# Patient Record
Sex: Female | Born: 1950 | Race: White | Hispanic: No | Marital: Married | Smoking: Never smoker
Health system: Southern US, Community
[De-identification: ages and names within clinical notes are randomized; demographics above are authoritative.]

## PROBLEM LIST (undated history)

## (undated) DIAGNOSIS — Z9889 Other specified postprocedural states: Secondary | ICD-10-CM

## (undated) DIAGNOSIS — K760 Fatty (change of) liver, not elsewhere classified: Secondary | ICD-10-CM

## (undated) DIAGNOSIS — J45909 Unspecified asthma, uncomplicated: Secondary | ICD-10-CM

## (undated) DIAGNOSIS — N189 Chronic kidney disease, unspecified: Secondary | ICD-10-CM

## (undated) DIAGNOSIS — R112 Nausea with vomiting, unspecified: Secondary | ICD-10-CM

## (undated) DIAGNOSIS — I10 Essential (primary) hypertension: Secondary | ICD-10-CM

## (undated) DIAGNOSIS — I4891 Unspecified atrial fibrillation: Secondary | ICD-10-CM

## (undated) HISTORY — DX: Morbid (severe) obesity due to excess calories: E66.01

## (undated) HISTORY — DX: Unspecified atrial fibrillation: I48.91

## (undated) HISTORY — PX: DILATION AND CURETTAGE OF UTERUS: SHX78

## (undated) HISTORY — DX: Fatty (change of) liver, not elsewhere classified: K76.0

## (undated) HISTORY — PX: TUBAL LIGATION: SHX77

## (undated) HISTORY — PX: FOOT SURGERY: SHX648

## (undated) HISTORY — PX: ABDOMINAL HYSTERECTOMY: SHX81

---

## 2004-03-01 ENCOUNTER — Other Ambulatory Visit: Admission: RE | Admit: 2004-03-01 | Discharge: 2004-03-01 | Payer: Self-pay | Admitting: Unknown Physician Specialty

## 2004-09-14 ENCOUNTER — Ambulatory Visit (HOSPITAL_COMMUNITY): Admission: RE | Admit: 2004-09-14 | Discharge: 2004-09-14 | Payer: Self-pay | Admitting: Family Medicine

## 2004-10-01 ENCOUNTER — Ambulatory Visit: Payer: Self-pay | Admitting: Orthopedic Surgery

## 2004-11-12 ENCOUNTER — Ambulatory Visit: Payer: Self-pay | Admitting: Orthopedic Surgery

## 2006-02-24 ENCOUNTER — Ambulatory Visit (HOSPITAL_COMMUNITY): Admission: RE | Admit: 2006-02-24 | Discharge: 2006-02-24 | Payer: Self-pay | Admitting: Family Medicine

## 2011-07-03 ENCOUNTER — Emergency Department
Admission: EM | Admit: 2011-07-03 | Discharge: 2011-07-03 | Disposition: A | Discharge status: Home / Self Care | Payer: BC Managed Care – PPO | Source: Home / Self Care | Attending: Family Medicine | Admitting: Family Medicine

## 2011-07-03 DIAGNOSIS — J209 Acute bronchitis, unspecified: Secondary | ICD-10-CM

## 2011-07-03 HISTORY — DX: Essential (primary) hypertension: I10

## 2011-07-03 HISTORY — DX: Unspecified asthma, uncomplicated: J45.909

## 2011-07-03 MED ORDER — ALBUTEROL SULFATE HFA 108 (90 BASE) MCG/ACT IN AERS: INHALATION_SPRAY | RESPIRATORY_TRACT | Status: DC

## 2011-07-03 MED ORDER — AZITHROMYCIN 250 MG PO TABS: ORAL_TABLET | ORAL | Status: AC

## 2011-07-03 NOTE — ED Provider Notes (Signed)
History     CSN: [id number]  Arrival date & time 07/03/11  1755   First MD Initiated Contact with Patient 07/03/11 1924      Chief Complaint  Patient presents with  . Laryngitis  . Cough      HPI Comments: Patient complains of onset of URI about 6 to 8 weeks ago, but still has a cough.  She has nasal congestion and post nasal drainage.  No shortness of breath or pleuritic pain.  No fevers, chills, and sweats.  She does not remember her last tetanus shot.   The history is provided by the patient.    Past Medical History  Diagnosis Date  . Diabetes mellitus   . Hypertension   . Reactive airway disease     Past Surgical History  Procedure Date  . Abdominal hysterectomy   . Foot surgery     Family History  Problem Relation Age of Onset  . Parkinsonism Mother   . Osteoarthritis Father     History  Substance Use Topics  . Smoking status: Never Smoker   . Smokeless tobacco: Not on file  . Alcohol Use: No    OB History    Grav Para Term Preterm Abortions TAB SAB Ect Mult Living                  Review of Systems No sore throat + cough No pleuritic pain + wheezing + nasal congestion + post-nasal drainage No sinus pain/pressure No itchy/red eyes No earache No hemoptysis No SOB No fever/chills No nausea No vomiting No abdominal pain No diarrhea No urinary symptoms No skin rashes No fatigue No myalgias No headache Used OTC meds without relief  Allergies  Review of patient's allergies indicates no known allergies.  Home Medications   Current Outpatient Rx  Name Route Sig Dispense Refill  . ACETAMINOPHEN 500 MG PO TABS Oral Take 500 mg by mouth every 6 (six) hours as needed.      Marland Kitchen BENAZEPRIL-HYDROCHLOROTHIAZIDE 20-25 MG PO TABS Oral Take 1 tablet by mouth daily.      Marland Kitchen METFORMIN HCL 500 MG PO TABS Oral Take 500 mg by mouth 2 (two) times daily with a meal.      . ONE-DAILY MULTI VITAMINS PO TABS Oral Take 1 tablet by mouth daily.      .  ALBUTEROL SULFATE HFA 108 (90 BASE) MCG/ACT IN AERS  Inhale one or two puffs into the lungs once or twice daily as needed. 1 Inhaler 0  . AZITHROMYCIN 250 MG PO TABS  Take 2 tabs today; then begin one tab once daily for 4 more days.   DEA # [id number] 6 each 0    BP 150/85  Pulse 89  Temp(Src) 98.4 F (36.9 C) (Oral)  Resp 18  Ht 5\' 6"  (1.676 m)  Wt 322 lb (146.058 kg)  BMI 51.97 kg/m2  SpO2 97%  Physical Exam Nursing notes and Vital Signs reviewed. Appearance:  Patient appears healthy, stated age, and in no acute distress.  Patient is morbidly obese (BMI 52.1) Eyes:  Pupils are equal, round, and reactive to light and accomodation.  Extraocular movement is intact.  Conjunctivae are not inflamed  Ears:  Canals normal.  Tympanic membranes normal.  Nose:  Mildly congested turbinates.  No sinus tenderness.   Marland Kitchen  Pharynx:  Normal Neck:  Supple.  Slightly tender shotty posterior nodes are palpated bilaterally  Lungs:  Clear to auscultation.  Breath sounds are equal.  Heart:  Regular rate and rhythm without murmurs, rubs, or gallops.  Abdomen:  Nontender without masses or hepatosplenomegaly.  Bowel sounds are present.  No CVA or flank tenderness.  Extremities:  No edema.  No calf tenderness Skin:  No rash present.   ED Course  Procedures  none      1. Acute bronchitis       MDM  ? Pertussis Begin Z-pack.  Rx for albuterol inhaler once or twice daily prn.  Take Mucinex  (guaifenesin) twice daily for congestion.  Increase fluid intake, rest. Follow-up with family doctor if not improving 7 to 10 days.  Recommend Tdap when well       Donna Christen, MD 07/08/11 1438

## 2011-07-03 NOTE — ED Notes (Signed)
States 8 weeks ago started with cough and clear sinus drainage unable to get rid of cough.Couple of days go now has color to sinus drainage

## 2011-10-27 ENCOUNTER — Emergency Department (HOSPITAL_COMMUNITY): Payer: BC Managed Care – PPO

## 2011-10-27 ENCOUNTER — Emergency Department (HOSPITAL_COMMUNITY)
Admission: EM | Admit: 2011-10-27 | Discharge: 2011-10-28 | Disposition: A | Discharge status: Home / Self Care | Payer: BC Managed Care – PPO | Attending: Emergency Medicine | Admitting: Emergency Medicine

## 2011-10-27 ENCOUNTER — Encounter (HOSPITAL_COMMUNITY): Payer: Self-pay | Admitting: Emergency Medicine

## 2011-10-27 DIAGNOSIS — I10 Essential (primary) hypertension: Secondary | ICD-10-CM | POA: Insufficient documentation

## 2011-10-27 DIAGNOSIS — R509 Fever, unspecified: Secondary | ICD-10-CM | POA: Insufficient documentation

## 2011-10-27 DIAGNOSIS — R109 Unspecified abdominal pain: Secondary | ICD-10-CM | POA: Insufficient documentation

## 2011-10-27 DIAGNOSIS — Z79899 Other long term (current) drug therapy: Secondary | ICD-10-CM | POA: Insufficient documentation

## 2011-10-27 DIAGNOSIS — N200 Calculus of kidney: Secondary | ICD-10-CM | POA: Insufficient documentation

## 2011-10-27 DIAGNOSIS — R300 Dysuria: Secondary | ICD-10-CM | POA: Insufficient documentation

## 2011-10-27 DIAGNOSIS — R3919 Other difficulties with micturition: Secondary | ICD-10-CM | POA: Insufficient documentation

## 2011-10-27 DIAGNOSIS — R6883 Chills (without fever): Secondary | ICD-10-CM | POA: Insufficient documentation

## 2011-10-27 DIAGNOSIS — E119 Type 2 diabetes mellitus without complications: Secondary | ICD-10-CM | POA: Insufficient documentation

## 2011-10-27 DIAGNOSIS — R110 Nausea: Secondary | ICD-10-CM | POA: Insufficient documentation

## 2011-10-27 DIAGNOSIS — N390 Urinary tract infection, site not specified: Secondary | ICD-10-CM

## 2011-10-27 LAB — URINALYSIS, ROUTINE W REFLEX MICROSCOPIC
Bilirubin Urine: NEGATIVE
Glucose, UA: NEGATIVE mg/dL
Ketones, ur: NEGATIVE mg/dL
Nitrite: NEGATIVE
Protein, ur: 30 mg/dL — AB
pH: 5.5 (ref 5.0–8.0)

## 2011-10-27 LAB — CBC
HCT: 39.8 % (ref 36.0–46.0)
MCH: 30.6 pg (ref 26.0–34.0)
MCHC: 33.9 g/dL (ref 30.0–36.0)
MCV: 90.2 fL (ref 78.0–100.0)
RDW: 13.1 % (ref 11.5–15.5)

## 2011-10-27 LAB — URINE MICROSCOPIC-ADD ON

## 2011-10-27 LAB — COMPREHENSIVE METABOLIC PANEL
ALT: 40 U/L — ABNORMAL HIGH (ref 0–35)
AST: 26 U/L (ref 0–37)
Alkaline Phosphatase: 68 U/L (ref 39–117)
BUN: 8 mg/dL (ref 6–23)
Calcium: 9.1 mg/dL (ref 8.4–10.5)
Chloride: 93 mEq/L — ABNORMAL LOW (ref 96–112)
Creatinine, Ser: 0.96 mg/dL (ref 0.50–1.10)
GFR calc Af Amer: 73 mL/min — ABNORMAL LOW (ref >90)
GFR calc non Af Amer: 63 mL/min — ABNORMAL LOW (ref >90)

## 2011-10-27 LAB — DIFFERENTIAL
Lymphocytes Relative: 8 % — ABNORMAL LOW (ref 12–46)
Lymphs Abs: 0.7 10*3/uL (ref 0.7–4.0)
Monocytes Absolute: 0.8 10*3/uL (ref 0.1–1.0)
Monocytes Relative: 9 % (ref 3–12)
Neutro Abs: 7.2 10*3/uL (ref 1.7–7.7)
Neutrophils Relative %: 82 % — ABNORMAL HIGH (ref 43–77)

## 2011-10-27 MED ORDER — SODIUM CHLORIDE 0.9 % IV SOLN
Freq: Once | INTRAVENOUS | Status: AC
  Administered 2011-10-27: 22:00:00 via INTRAVENOUS

## 2011-10-27 MED ORDER — KETOROLAC TROMETHAMINE 30 MG/ML IJ SOLN
30.0000 mg | Freq: Once | INTRAMUSCULAR | Status: AC
  Administered 2011-10-27: 30 mg via INTRAVENOUS
  Filled 2011-10-27: qty 1

## 2011-10-27 MED ORDER — DEXTROSE 5 % IV SOLN
1.0000 g | Freq: Once | INTRAVENOUS | Status: DC
  Filled 2011-10-27: qty 10

## 2011-10-27 MED ORDER — ONDANSETRON HCL 4 MG/2ML IJ SOLN
4.0000 mg | Freq: Once | INTRAMUSCULAR | Status: AC
  Administered 2011-10-27: 4 mg via INTRAVENOUS
  Filled 2011-10-27: qty 2

## 2011-10-27 MED ORDER — DEXTROSE 5 % IV SOLN: 1.0000 g | Freq: Once | INTRAVENOUS | Status: DC

## 2011-10-27 NOTE — ED Notes (Signed)
Pt woke this am with left flank pain and nausea as well as chills.

## 2011-10-27 NOTE — ED Provider Notes (Signed)
History     CSN: [id number]  Arrival date & time 10/27/11  2120   First MD Initiated Contact with Patient 10/27/11 2141      Chief Complaint  Patient presents with  . Flank Pain  . Chills  . Nausea    (Consider location/radiation/quality/duration/timing/severity/associated sxs/prior treatment) HPI Comments: Crystal Roth woke early this morning with discomfort in her left flank along with nausea and chills but no emesis.  She has also had several episodes of dysuria interspersed with urinary retention.  Her urine has been increasingly cloudy, she denies hematuria.  Her fever has been up to 101.  Pain has been sharp and intermittent and does radiate briefly at times into her left lower flank.  It is not worsened with palpation or movement.  The history is provided by the patient.    Past Medical History  Diagnosis Date  . Diabetes mellitus   . Hypertension   . Reactive airway disease     Past Surgical History  Procedure Date  . Abdominal hysterectomy   . Foot surgery     Family History  Problem Relation Age of Onset  . Parkinsonism Mother   . Osteoarthritis Father     History  Substance Use Topics  . Smoking status: Never Smoker   . Smokeless tobacco: Not on file  . Alcohol Use: No    OB History    Grav Para Term Preterm Abortions TAB SAB Ect Mult Living                  Review of Systems  Constitutional: Negative for fever.  HENT: Negative for congestion, sore throat and neck pain.   Eyes: Negative.   Respiratory: Negative for chest tightness and shortness of breath.   Cardiovascular: Negative for chest pain.  Gastrointestinal: Negative for nausea and abdominal pain.  Genitourinary: Positive for dysuria, flank pain and difficulty urinating. Negative for urgency.  Musculoskeletal: Negative for joint swelling and arthralgias.  Skin: Negative.  Negative for rash and wound.  Neurological: Negative for dizziness, weakness, light-headedness, numbness and  headaches.  Hematological: Negative.   Psychiatric/Behavioral: Negative.     Allergies  Review of patient's allergies indicates no known allergies.  Home Medications   Current Outpatient Rx  Name Route Sig Dispense Refill  . ACETAMINOPHEN 500 MG PO TABS Oral Take 500 mg by mouth every 6 (six) hours as needed.      . ALBUTEROL SULFATE HFA 108 (90 BASE) MCG/ACT IN AERS  Inhale one or two puffs into the lungs once or twice daily as needed. 1 Inhaler 0  . BENAZEPRIL-HYDROCHLOROTHIAZIDE 20-25 MG PO TABS Oral Take 1 tablet by mouth daily.      Marland Kitchen METFORMIN HCL 500 MG PO TABS Oral Take 500 mg by mouth 2 (two) times daily with a meal.      . ONE-DAILY MULTI VITAMINS PO TABS Oral Take 1 tablet by mouth daily.      . CEPHALEXIN 250 MG PO CAPS Oral Take 1 capsule (250 mg total) by mouth 4 (four) times daily. 28 capsule 0  . ONDANSETRON 8 MG PO TBDP Oral Take 1 tablet (8 mg total) by mouth every 8 (eight) hours as needed for nausea. 20 tablet 0  . OXYCODONE-ACETAMINOPHEN 5-325 MG PO TABS Oral Take 1 tablet by mouth every 4 (four) hours as needed for pain. 15 tablet 0    BP 113/70  Pulse 77  Temp(Src) 99.5 F (37.5 C) (Oral)  Resp 16  Ht 5'  5" (1.651 m)  Wt 304 lb (137.893 kg)  BMI 50.59 kg/m2  SpO2 94%  Physical Exam  Nursing note and vitals reviewed. Constitutional: She appears well-developed and well-nourished.  HENT:  Head: Normocephalic and atraumatic.  Eyes: Conjunctivae are normal.  Neck: Normal range of motion.  Cardiovascular: Normal rate, regular rhythm, normal heart sounds and intact distal pulses.   Pulmonary/Chest: Effort normal and breath sounds normal. She has no wheezes.  Abdominal: Soft. Bowel sounds are normal. There is no tenderness. There is CVA tenderness. There is no rebound and no guarding.       Left CVA tenderness.  Musculoskeletal: Normal range of motion.  Neurological: She is alert.  Skin: Skin is warm and dry.  Psychiatric: She has a normal mood and affect.     ED Course  Procedures (including critical care time)  Labs Reviewed  DIFFERENTIAL - Abnormal; Notable for the following:    Neutrophils Relative 82 (*)    Lymphocytes Relative 8 (*)    All other components within normal limits  COMPREHENSIVE METABOLIC PANEL - Abnormal; Notable for the following:    Sodium 133 (*)    Potassium 3.3 (*)    Chloride 93 (*)    Glucose, Bld 177 (*)    Albumin 3.4 (*)    ALT 40 (*)    Total Bilirubin 1.5 (*)    GFR calc non Af Amer 63 (*)    GFR calc Af Amer 73 (*)    All other components within normal limits  URINALYSIS, ROUTINE W REFLEX MICROSCOPIC - Abnormal; Notable for the following:    APPearance HAZY (*)    Hgb urine dipstick SMALL (*)    Protein, ur 30 (*)    Leukocytes, UA TRACE (*)    All other components within normal limits  URINE MICROSCOPIC-ADD ON - Abnormal; Notable for the following:    Squamous Epithelial / LPF FEW (*)    Bacteria, UA FEW (*)    All other components within normal limits  CBC  URINE CULTURE   Ct Abdomen Pelvis Wo Contrast  10/27/2011  *RADIOLOGY REPORT*  Clinical Data: Flank pain, left-sided  CT ABDOMEN AND PELVIS WITHOUT CONTRAST  Technique:  Multidetector CT imaging of the abdomen and pelvis was performed following the standard protocol without intravenous contrast.  Comparison: Abdominal ultrasound 02/24/2006  Findings: Lung bases are clear.  Hepatic hypodensity may indicate steatosis.  Gallbladder, spleen, adrenal glands, pancreas, and right kidney are normal in appearance.  There is moderate left hydronephrosis, renal enlargement, and left perinephric stranding.  Staghorn type curvilinear 2.4 cm calculus identified at the left renal pelvis image 40.  Distal ureters are decompressed.  No radiopaque ureteral or bladder calculus on either side. 1 mm left lower renal pole nonobstructing calculus identified image 44.  No ascites or lymphadenopathy.  Ovaries are normal.  Uterus presumed surgically absent.  Bladder is  normal.  No bowel wall thickening or focal segmental dilatation.  Appendix is normal.  Fat containing umbilical hernia is present.  No acute osseous finding.  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  Original Report Authenticated By: Harrel Lemon, M.D.     1. Urinary tract infection   2. Kidney stone     Patient was given 1 g of Rocephin prior to discharge home.  Toradol 30 mg IV completely relieved her pain.  MDM  Patient discussed with Dr. Estell Harpin prior to discharge home.  Plan will be for patient  to have definitive followup recheck by her PCP tomorrow and patient is agreeable with this plan.  She will return here for recheck if she is unable to be seen by her PCP.  She will continue with Keflex antibiotic, also prescribed oxycodone and Zofran if needed for nausea and continued pain.  Discussed that she will need a urologist to address this staghorn calculus which her PCP can refer her to, but the immediate goal is to alleviate this infection.        Candis Musa, PA 10/28/11 0020

## 2011-10-28 MED ORDER — ONDANSETRON 8 MG PO TBDP: 8.0000 mg | ORAL_TABLET | Freq: Three times a day (TID) | ORAL | Status: AC | PRN

## 2011-10-28 MED ORDER — CEPHALEXIN 250 MG PO CAPS: 250.0000 mg | ORAL_CAPSULE | Freq: Four times a day (QID) | ORAL | Status: DC

## 2011-10-28 MED ORDER — OXYCODONE-ACETAMINOPHEN 5-325 MG PO TABS: 1.0000 | ORAL_TABLET | ORAL | Status: AC | PRN

## 2011-10-28 NOTE — Discharge Instructions (Signed)
Kidney Stones  Kidney stones (ureteral lithiasis) are deposits that form inside your kidneys. The intense pain is caused by the stone moving through the urinary tract. When the stone moves, the ureter goes into spasm around the stone. The stone is usually passed in the urine.   CAUSES    A disorder that makes certain neck glands produce too much parathyroid hormone (primary hyperparathyroidism).   A buildup of uric acid crystals.   Narrowing (stricture) of the ureter.   A kidney obstruction present at birth (congenital obstruction).   Previous surgery on the kidney or ureters.   Numerous kidney infections.  SYMPTOMS    Feeling sick to your stomach (nauseous).   Throwing up (vomiting).   Blood in the urine (hematuria).   Pain that usually spreads (radiates) to the groin.   Frequency or urgency of urination.  DIAGNOSIS    Taking a history and physical exam.   Blood or urine tests.   Computerized X-ray scan (CT scan).   Occasionally, an examination of the inside of the urinary bladder (cystoscopy) is performed.  TREATMENT    Observation.   Increasing your fluid intake.   Surgery may be needed if you have severe pain or persistent obstruction.  The size, location, and chemical composition are all important variables that will determine the proper choice of action for you. Talk to your caregiver to better understand your situation so that you will minimize the risk of injury to yourself and your kidney.   HOME CARE INSTRUCTIONS    Drink enough water and fluids to keep your urine clear or pale yellow.   Strain all urine through the provided strainer. Keep all particulate matter and stones for your caregiver to see. The stone causing the pain may be as small as a grain of salt. It is very important to use the strainer each and every time you pass your urine. The collection of your stone will allow your caregiver to analyze it and verify that a stone has actually passed.   Only take over-the-counter or  prescription medicines for pain, discomfort, or fever as directed by your caregiver.   Make a follow-up appointment with your caregiver as directed.   Get follow-up X-rays if required. The absence of pain does not always mean that the stone has passed. It may have only stopped moving. If the urine remains completely obstructed, it can cause loss of kidney function or even complete destruction of the kidney. It is your responsibility to make sure X-rays and follow-ups are completed. Ultrasounds of the kidney can show blockages and the status of the kidney. Ultrasounds are not associated with any radiation and can be performed easily in a matter of minutes.  SEEK IMMEDIATE MEDICAL CARE IF:    Pain cannot be controlled with the prescribed medicine.   You have a fever.   The severity or intensity of pain increases over 18 hours and is not relieved by pain medicine.   You develop a new onset of abdominal pain.   You feel faint or pass out.  MAKE SURE YOU:    Understand these instructions.   Will watch your condition.   Will get help right away if you are not doing well or get worse.  Document Released: 06/24/2005 Document Revised: 06/13/2011 Document Reviewed: 10/20/2009  Exit[employer] Patient Information 2012 [employer]    Urinary Tract Infection  Infections of the urinary tract can start in several places. A bladder infection (cystitis), a kidney infection (pyelonephritis), and a   prostate infection (prostatitis) are different types of urinary tract infections (UTIs). They usually get better if treated with medicines (antibiotics) that kill germs. Take all the medicine until it is gone. You or your child may feel better in a few days, but TAKE ALL MEDICINE or the infection may not respond and may become more difficult to treat.  HOME CARE INSTRUCTIONS    Drink enough water and fluids to keep the urine clear or pale yellow. Cranberry juice is especially recommended, in addition to large amounts of  water.   Avoid caffeine, tea, and carbonated beverages. They tend to irritate the bladder.   Alcohol may irritate the prostate.   Only take over-the-counter or prescription medicines for pain, discomfort, or fever as directed by your caregiver.  To prevent further infections:   Empty the bladder often. Avoid holding urine for long periods of time.   After a bowel movement, women should cleanse from front to back. Use each tissue only once.   Empty the bladder before and after sexual intercourse.  FINDING OUT THE RESULTS OF YOUR TEST  Not all test results are available during your visit. If your or your child's test results are not back during the visit, make an appointment with your caregiver to find out the results. Do not assume everything is normal if you have not heard from your caregiver or the medical facility. It is important for you to follow up on all test results.  SEEK MEDICAL CARE IF:    There is back pain.   Your baby is older than 3 months with a rectal temperature of 100.5 F (38.1 C) or higher for more than 1 day.   Your or your child's problems (symptoms) are no better in 3 days. Return sooner if you or your child is getting worse.  SEEK IMMEDIATE MEDICAL CARE IF:    There is severe back pain or lower abdominal pain.   You or your child develops chills.   You have a fever.   Your baby is older than 3 months with a rectal temperature of 102 F (38.9 C) or higher.   Your baby is 3 months old or younger with a rectal temperature of 100.4 F (38 C) or higher.   There is nausea or vomiting.   There is continued burning or discomfort with urination.  MAKE SURE YOU:    Understand these instructions.   Will watch your condition.   Will get help right away if you are not doing well or get worse.  Document Released: 04/03/2005 Document Revised: 06/13/2011 Document Reviewed: 11/06/2006  [employer] Patient Information 2012 [employer]

## 2011-10-29 LAB — URINE CULTURE
Colony Count: 35000
Culture  Setup Time: 201304221030

## 2011-10-29 NOTE — ED Provider Notes (Signed)
Medical screening examination/treatment/procedure(s) were conducted as a shared visit with non-physician practitioner(s) and myself.  I personally evaluated the patient during the encounter   Mckenley Birenbaum L Emilina Smarr, MD 10/29/11 0630 

## 2011-11-05 ENCOUNTER — Other Ambulatory Visit: Payer: Self-pay | Admitting: Urology

## 2011-11-05 ENCOUNTER — Encounter (HOSPITAL_COMMUNITY): Payer: Self-pay | Admitting: *Deleted

## 2011-11-05 ENCOUNTER — Encounter (HOSPITAL_COMMUNITY): Payer: Self-pay | Admitting: Pharmacy Technician

## 2011-11-05 NOTE — Pre-Procedure Instructions (Signed)
Patient instructed not to take any aspirin,ibuprofen,etc prior to litho. To arrive in [facility] at 1000 am with blue folder, driver, picture ID, insurance info. Patient not to take metformin the day of litho. To follow laxative instructions in folder, NPO after midnight for solid food may have meds with a sip.patient verbalized understanding of instructions.                                              Patient also revealed she has irregular heart beat , to come to [facility] for EKG 11/06/2011.

## 2011-11-06 ENCOUNTER — Inpatient Hospital Stay (HOSPITAL_COMMUNITY): Admission: RE | Admit: 2011-11-06 | Payer: BC Managed Care – PPO | Source: Ambulatory Visit

## 2011-11-06 ENCOUNTER — Ambulatory Visit (HOSPITAL_COMMUNITY): Payer: BC Managed Care – PPO | Attending: Cardiology

## 2011-11-06 ENCOUNTER — Other Ambulatory Visit (HOSPITAL_COMMUNITY): Payer: Self-pay

## 2011-11-06 ENCOUNTER — Ambulatory Visit (INDEPENDENT_AMBULATORY_CARE_PROVIDER_SITE_OTHER): Payer: BC Managed Care – PPO | Admitting: Internal Medicine

## 2011-11-06 ENCOUNTER — Other Ambulatory Visit: Payer: Self-pay

## 2011-11-06 VITALS — BP 130/76 | HR 88 | Ht 64.5 in | Wt 299.8 lb

## 2011-11-06 DIAGNOSIS — E119 Type 2 diabetes mellitus without complications: Secondary | ICD-10-CM | POA: Insufficient documentation

## 2011-11-06 DIAGNOSIS — I4891 Unspecified atrial fibrillation: Secondary | ICD-10-CM | POA: Insufficient documentation

## 2011-11-06 DIAGNOSIS — Z01810 Encounter for preprocedural cardiovascular examination: Secondary | ICD-10-CM | POA: Insufficient documentation

## 2011-11-06 DIAGNOSIS — I10 Essential (primary) hypertension: Secondary | ICD-10-CM | POA: Insufficient documentation

## 2011-11-06 MED ORDER — APIXABAN 5 MG PO TABS: 5.0000 mg | ORAL_TABLET | Freq: Two times a day (BID) | ORAL | Status: DC

## 2011-11-06 NOTE — Progress Notes (Signed)
CARDIOLOGY CONSULT NOTE  Patient ID: Crystal Roth, MRN: [id number], DOB/AGE: 60-Apr-1952 60 y.o. Admit date: (Not on file) Date of Consult: 11/06/2011  Primary Physician: Harlow Asa, MD, MD    Chief Complaint   HPI Crystal Roth is a 61 y.o. female : Seen at the request of urology and anesthesia because of a newly made diagnosis of atrial fibrillation.  She was being evaluated for lithotripsy. She has developed urinary tract and kidney infection over the last couple of weeks requiring multiple antibiotics. She has a history of kidney stones and now is Staghorn calculus and lithotripsy was recommended.  She denies any palpitations. While she has a more remote history of PVCs couplets and "nonsustained ventricular tachycardia" she has noted no change in her exercise tolerance of late. She has no shortness of breath, orthopnea or chest pain. She does have peripheral edema which has been largely improved Viramune and thickened in her feet up the last week and a half.  Her thromboembolic risk factors are notable for age-4, hypertension-1, diabetes-1 4 at bedtime score of 2 and a CHADS-VASc score of 3.  She has a history of a remote Myoview for atypical chest pain that was negative.  She denies snoring but she does have daytime somnolence.    Past Medical History  Diagnosis Date  . Diabetes mellitus   . Hypertension   . Reactive airway disease   . Dysrhythmia   . kidney calculus   . PONV (postoperative nausea and vomiting)       Surgical History:  Past Surgical History  Procedure Date  . Abdominal hysterectomy   . Foot surgery   . Dilation and curettage of uterus   . Tubal ligation      Home Meds: Prior to Admission medications   Medication Sig Start Date End Date Taking? Authorizing Provider  acetaminophen (TYLENOL) 500 MG tablet Take 500 mg by mouth every 6 (six) hours as needed. Pain    Yes Historical Provider, MD  albuterol (PROVENTIL HFA;VENTOLIN HFA) 108 (90 BASE)  MCG/ACT inhaler Inhale 1-2 puffs into the lungs every 6 (six) hours as needed. Wheezing 07/03/11  Yes Lattie Haw, MD  benazepril-hydrochlorthiazide (LOTENSIN HCT) 20-25 MG per tablet Take 1 tablet by mouth daily before breakfast.    Yes Historical Provider, MD  metFORMIN (GLUCOPHAGE) 500 MG tablet Take 500-1,000 mg by mouth 2 (two) times daily with a meal. Take 2 tablets if bs >150   Yes Historical Provider, MD  ondansetron (ZOFRAN-ODT) 8 MG disintegrating tablet Take 8 mg by mouth every 8 (eight) hours as needed. Nausea   Yes Historical Provider, MD  OVER THE COUNTER MEDICATION Take 1 Package by mouth daily. melluca vitamin pack   Yes Historical Provider, MD  oxyCODONE-acetaminophen (PERCOCET) 5-325 MG per tablet Take 1 tablet by mouth every 4 (four) hours as needed for pain. 10/28/11 11/07/11 Yes Burgess Amor, PA  PROBIOTIC CAPS Take by mouth daily.   Yes Historical Provider, MD  sulfamethoxazole-trimethoprim (BACTRIM DS) 800-160 MG per tablet Take 1 tablet by mouth 2 (two) times daily.   Yes Historical Provider, MD     Allergies:  Allergies  Allergen Reactions  . Nsaids Shortness Of Breath    History   Social History  . Marital Status: Married    Spouse Name: N/A    Number of Children: N/A  . Years of Education: N/A   Occupational History  . Not on file.   Social History Main Topics  . Smoking status:  Never Smoker   . Smokeless tobacco: Not on file  . Alcohol Use: No  . Drug Use: No  . Sexually Active:    Other Topics Concern  . Not on file   Social History Narrative  . No narrative on file     Family History  Problem Relation Age of Onset  . Parkinsonism Mother   . Osteoarthritis Father      ROS:  Please see the history of present illness.   Negative except GE reflux, and eczema  All other systems reviewed and negative.    Physical Exam:  Blood pressure 130/76, pulse 88, height 5' 4.5" (1.638 m), weight 299 lb 12.8 oz (135.988 kg). General: Well developed,   morbidly obese Caucasian  female in no acute distress. Head: Normocephalic, atraumatic, sclera non-icteric, no xanthomas, nares are without discharge. Lymph Nodes:  none Back without kyphosis or scoliosis  Neck: Negative for carotid bruits. JVD not elevated. Lungs: Clear bilaterally to auscultation without wheezes, rales, or rhonchi. Breathing is unlabored. Heart: irregularly irregularwith S1 S2. 2/6 murmur that varies with the RR intervals, rubs, or gallops appreciated. Abdomen: Soft, non-tender, non-distended with normoactive bowel sounds. No hepatomegaly. No rebound/guarding. No obvious abdominal masses. Msk:  Strength and tone appear normal for age. Extremities: No clubbing or cyanosis. No edema.  Distal pedal pulses are 2+ and equal bilaterally. Skin: Warm and Dry Neuro: Alert and oriented X 3. CN III-XII intact Grossly normal sensory and motor function . Psych:  Responds to questions appropriately with a normal affect.      Labs: Cardiac Enzymes No results found for this basename: CKTOTAL:4,CKMB:4,TROPONINI:4 in the last 72 hours CBC Lab Results  Component Value Date   WBC 8.7 10/27/2011   HGB 13.5 10/27/2011   HCT 39.8 10/27/2011   MCV 90.2 10/27/2011   PLT 189 10/27/2011    Radiology/Studies:  Ct Abdomen Pelvis Wo Contrast  10/27/2011  *RADIOLOGY REPORT*  Clinical Data: Flank pain, left-sided  CT ABDOMEN AND PELVIS WITHOUT CONTRAST  Technique:  Multidetector CT imaging of the abdomen and pelvis was performed following the standard protocol without intravenous contrast.  Comparison: Abdominal ultrasound 02/24/2006  Findings: Lung bases are clear.  Hepatic hypodensity may indicate steatosis.  Gallbladder, spleen, adrenal glands, pancreas, and right kidney are normal in appearance.  There is moderate left hydronephrosis, renal enlargement, and left perinephric stranding.  Staghorn type curvilinear 2.4 cm calculus identified at the left renal pelvis image 40.  Distal ureters are  decompressed.  No radiopaque ureteral or bladder calculus on either side. 1 mm left lower renal pole nonobstructing calculus identified image 44.  No ascites or lymphadenopathy.  Ovaries are normal.  Uterus presumed surgically absent.  Bladder is normal.  No bowel wall thickening or focal segmental dilatation.  Appendix is normal.  Fat containing umbilical hernia is present.  No acute osseous finding.  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  Original Report Authenticated By: Harrel Lemon, M.D.     Echocardiogram: Normal left ventricular function biatrial enlargement mild pulmonary hypertension  EKG: atrial fibrillation at 94 Intervals-/09/37 Axis of 48    Assessment and Plan:

## 2011-11-06 NOTE — Patient Instructions (Signed)
Your physician has requested that you have an echocardiogram. Echocardiography is a painless test that uses sound waves to create images of your heart. It provides your doctor with information about the size and shape of your heart and how well your heart's chambers and valves are working. This procedure takes approximately one hour. There are no restrictions for this procedure.  Your physician has recommended you make the following change in your medication:  1) Start Eliquis (apixaban) 5 mg one tablet by mouth twice daily after Dr. Brunilda Payor gives you the ok to do this following your procedure.  Your physician recommends that you schedule a follow-up appointment in: 3-4 weeks with Dr. Graciela Husbands or Tereso Newcomer, PA on a day Dr. Graciela Husbands is in the office.

## 2011-11-06 NOTE — Assessment & Plan Note (Signed)
The patient has asymptomatic atrial fibrillation in the setting of a thromboembolic risk profile or require long-term anticoagulation. I would favor beginning anticoagulation when it is okay with urology and then undertaking elective cardioversion 3-4 weeks later to see if we can restore sinus rhythm.  We've reviewed the benefits and risks of anticoagulation.  The other issue is with her body habitus hypertension and daytime somnolence whether she has sleep apnea which frequently is coccurring with atrial fibrillation and is so therapy would be valuable both in terms of  f reducing blood pressure as well as decreasing the propensity to atrial fibrillation

## 2011-11-06 NOTE — Progress Notes (Addendum)
Pt presents today for EKG prior to lithotripsy tomorrow. She reports leg cramp on 11/05/11. Her potassium from 10/27/11 was 3.3. She states nausea which she relates to urinary infection and antibiotic use as source of nausea. Unconfirmed EKG shows AFib. Paged Dr Brunilda Payor and informed him of abnormal EKG. He is going to call cardiologist today for a consult. 1400: Pt to go to Dr Berton Mount today for assessment. She is given copy of EKG to give to him. He will assess pt and give clearance for lithotripsy on 11/07/11 if pt is stable.

## 2011-11-06 NOTE — Assessment & Plan Note (Signed)
She should be exceptionable risk for her procedure. She has normal left ventricular function and asymptomatic atrial fibrillation with a reasonably controlled ventricular response. Anticoagulation should be resumed per Dr. Brunilda Payor when he feels it is appropriate. She'll be using apixoban which has a rapid onset of action and I will need to be taken into consideration as to when to initiate

## 2011-11-07 ENCOUNTER — Encounter (HOSPITAL_COMMUNITY)
Admission: RE | Disposition: A | Discharge status: Home / Self Care | Payer: Self-pay | Source: Ambulatory Visit | Attending: Urology

## 2011-11-07 ENCOUNTER — Encounter (HOSPITAL_COMMUNITY): Payer: Self-pay

## 2011-11-07 ENCOUNTER — Ambulatory Visit (HOSPITAL_COMMUNITY): Payer: BC Managed Care – PPO

## 2011-11-07 ENCOUNTER — Telehealth: Payer: Self-pay

## 2011-11-07 ENCOUNTER — Ambulatory Visit (HOSPITAL_COMMUNITY)
Admission: RE | Admit: 2011-11-07 | Discharge: 2011-11-07 | Disposition: A | Discharge status: Home / Self Care | Payer: BC Managed Care – PPO | Source: Ambulatory Visit | Attending: Urology | Admitting: Urology

## 2011-11-07 ENCOUNTER — Encounter: Payer: Self-pay | Admitting: Internal Medicine

## 2011-11-07 DIAGNOSIS — R12 Heartburn: Secondary | ICD-10-CM | POA: Insufficient documentation

## 2011-11-07 DIAGNOSIS — Z79899 Other long term (current) drug therapy: Secondary | ICD-10-CM | POA: Insufficient documentation

## 2011-11-07 DIAGNOSIS — I10 Essential (primary) hypertension: Secondary | ICD-10-CM | POA: Insufficient documentation

## 2011-11-07 DIAGNOSIS — I499 Cardiac arrhythmia, unspecified: Secondary | ICD-10-CM | POA: Insufficient documentation

## 2011-11-07 DIAGNOSIS — N200 Calculus of kidney: Secondary | ICD-10-CM | POA: Insufficient documentation

## 2011-11-07 DIAGNOSIS — E119 Type 2 diabetes mellitus without complications: Secondary | ICD-10-CM | POA: Insufficient documentation

## 2011-11-07 DIAGNOSIS — J45909 Unspecified asthma, uncomplicated: Secondary | ICD-10-CM | POA: Insufficient documentation

## 2011-11-07 HISTORY — DX: Other specified postprocedural states: Z98.890

## 2011-11-07 HISTORY — DX: Nausea with vomiting, unspecified: R11.2

## 2011-11-07 HISTORY — DX: Chronic kidney disease, unspecified: N18.9

## 2011-11-07 LAB — GLUCOSE, CAPILLARY: Glucose-Capillary: 128 mg/dL — ABNORMAL HIGH (ref 70–99)

## 2011-11-07 SURGERY — LITHOTRIPSY, ESWL
Anesthesia: LOCAL | Laterality: Left

## 2011-11-07 MED ORDER — CIPROFLOXACIN HCL 500 MG PO TABS
500.0000 mg | ORAL_TABLET | ORAL | Status: AC
  Administered 2011-11-07: 500 mg via ORAL

## 2011-11-07 MED ORDER — DIAZEPAM 5 MG PO TABS
ORAL_TABLET | ORAL | Status: AC
  Administered 2011-11-07: 10 mg via ORAL
  Filled 2011-11-07: qty 2

## 2011-11-07 MED ORDER — DEXTROSE-NACL 5-0.45 % IV SOLN
INTRAVENOUS | Status: DC
  Administered 2011-11-07: 11:00:00 via INTRAVENOUS

## 2011-11-07 MED ORDER — DIPHENHYDRAMINE HCL 25 MG PO CAPS
ORAL_CAPSULE | ORAL | Status: AC
  Administered 2011-11-07: 25 mg via ORAL
  Filled 2011-11-07: qty 1

## 2011-11-07 MED ORDER — DIPHENHYDRAMINE HCL 25 MG PO CAPS
25.0000 mg | ORAL_CAPSULE | ORAL | Status: AC
  Administered 2011-11-07: 25 mg via ORAL

## 2011-11-07 MED ORDER — DIAZEPAM 5 MG PO TABS
10.0000 mg | ORAL_TABLET | ORAL | Status: AC
  Administered 2011-11-07: 10 mg via ORAL

## 2011-11-07 MED ORDER — CIPROFLOXACIN HCL 500 MG PO TABS
ORAL_TABLET | ORAL | Status: AC
  Administered 2011-11-07: 500 mg via ORAL
  Filled 2011-11-07: qty 1

## 2011-11-07 NOTE — H&P (Signed)
History of Present Illness     Crystal Roth is a referral from Dr Lubertha South.  She was seen in the ER at [facility] for severe left flank pain associated with nausea, fever on 4/21.  CT scan revealed a 2.4 cm left renal calculus with moderate hydronephrosis.  She was treated with IV rocephin and sent home on Keflex, Zofran and Percocet.  She went to see Dr Gerda Diss a week ago who gave her IM Rocephin and put her on Cipro.  She continues to have low grade fever.  She saw the PA last Saturday and was switched to [place] which she is still taking.She does not have any more nausea or pain.  Urine culture at [facility] showed multiple bacteria.  Urinalysis today shows 11-20 WBC's, 0-3 RBC's.   She has moderate stress urinary incontinence.  She denies frequency, urgency, dysuria.   Past Medical History Problems  1. History of  Asthma 493.90 2. History of  Heartburn 787.1 3. History of  Hypertension 401.9 4. History of  Murmurs 785.2 5. History of  Sinus Arrhythmia 427.9  Surgical History Problems  1. History of  Dilation And Curettage 2. History of  Foot Surgery Left 3. History of  Hysterectomy V45.77  Current Meds 1. Benazepril-Hydrochlorothiazide 20-25 MG Oral Tablet; Therapy: (Recorded:30Apr2013) to 2. Florastor CAPS; Therapy: (Recorded:30Apr2013) to 3. Glucophage 500 MG Oral Tablet; Therapy: (Recorded:30Apr2013) to 4. Percocet 5-325 MG Oral Tablet; Therapy: (Recorded:30Apr2013) to 5. Septra DS TABS; Therapy: (Recorded:30Apr2013) to 6. Zofran 8 MG Oral Tablet; Therapy: (Recorded:30Apr2013) to  Allergies Medication  1. No Known Drug Allergies  Family History Problems  1. Family history of  Family Health Status - Father's Age 61. Family history of  Family Health Status - Mother's Age 11. Family history of  Family Health Status Number Of Children 4. Family history of  Parkinson's Disease  Social History Problems    Alcohol Use   Caffeine Use   Marital History - Currently  Married   Never A Smoker   Occupation:  Review of Systems Genitourinary, constitutional, skin, eye, otolaryngeal, hematologic/lymphatic, cardiovascular, pulmonary, endocrine, musculoskeletal, gastrointestinal, neurological and psychiatric system(s) were reviewed and pertinent findings if present are noted.  Genitourinary: incontinence and hematuria.  Gastrointestinal: nausea, abdominal pain, heartburn and diarrhea.  Constitutional: fever and feeling tired (fatigue).  Cardiovascular: leg swelling.  Respiratory: cough.    Vitals Vital Signs [Data Includes: Last 1 Day]  30Apr2013 11:11AM  BMI Calculated: 50.59 BSA Calculated: 2.34 Height: 5 ft 4.5 in Weight: 300 lb  Blood Pressure: 134 / 79 Temperature: 98.5 F Heart Rate: 96 Respiration: 18  Physical Exam Constitutional: Well nourished and well developed . No acute distress.  ENT:. The ears and nose are normal in appearance.  Neck: The appearance of the neck is normal and no neck mass is present.  Pulmonary: No respiratory distress and normal respiratory rhythm and effort.  Cardiovascular: Heart rate and rhythm are normal . No peripheral edema.  Abdomen: The abdomen is obese. The abdomen is soft and nontender. No masses are palpated. No CVA tenderness. No hernias are palpable. No hepatosplenomegaly noted.  Genitourinary: Examination of the external genitalia shows normal female external genitalia. The urethra is normal in appearance. Vaginal exam demonstrates no abnormalities. A cystocele is present with a midline defect (grade 2 /4). A rectocele is present. The cervix is is absent. The uterus is absent. The bladder is normal on palpation.  Lymphatics: The femoral and inguinal nodes are not enlarged or tender.  Skin: Normal skin turgor, no visible rash and no visible skin lesions.  Neuro/Psych:. Mood and affect are appropriate.    Results/Data Urine [Data Includes: Last 1 Day]   30Apr2013  COLOR ORANGE   APPEARANCE CLEAR     SQUAMOUS EPITHELIAL/HPF FEW   WBC 11-20 WBC/hpf  RBC 0-3 RBC/hpf  BACTERIA RARE   CRYSTALS NONE SEEN   CASTS NONE SEEN     I have independently reviewed the CT with the patient and the findings are as noted above.   Assessment Assessed  1. Nephrolithiasis Of The Left Kidney 592.0 2. Urinary Tract Infection 599.0  Plan Health Maintenance (V70.0)  1. UA With REFLEX  Done: 30Apr2013 10:37AM Nephrolithiasis Of The Left Kidney (592.0)  2. Follow-up Schedule Surgery Office  Follow-up  Requested for: 30Apr2013   Urine culture.  Continue Septra.  Will switch to a different antibiotic if culture results dictate. I discussed the treatment options with the patient: ESL, PCNL, ureteroscopy with holmium laser.  The risks, benefits of each option were discussed with the patient.  She agrees to proceed with ESL.  She understands that she might require more than one treatment to clear the stone.  The risks of the procedure include but are not limited to hemorrhage, infection, renal or perirenal hematoma, injury to adjacent organs, steinstarsse.  She nderstands and wishes to proceed..   Signatures  CC: Dr Lubertha South  Electronically signed by : Su Grand, M.D.; Nov 05 2011 12:17PM

## 2011-11-07 NOTE — Discharge Instructions (Signed)
Lithotripsy Care After Refer to this sheet for the next few weeks. These discharge instructions provide you with general information on caring for yourself after you leave the hospital. Your caregiver may also give you specific instructions. Your treatment has been planned according to the most current medical practices available, but unavoidable complications sometimes occur. If you have any problems or questions after discharge, please call your caregiver. AFTER THE PROCEDURE   The recovery time will vary with the procedure done.   You will be taken to the recovery area. A nurse will watch and check your progress. Once you are awake, stable, and taking fluids well, you will be allowed to go home as long as there are no problems.   Your urine may have a red tinge for a few days after treatment. Blood loss is usually minimal.   You may have soreness in the back or flank area. This usually goes away after a few days. The procedure can cause blotches or bruises on the back where the pressure wave enters the skin. These marks usually cause only minimal discomfort and should disappear in a short time.   Stone fragments should begin to pass within 24 hours of treatment. However, a delayed passage is not unusual.   You may have pain, discomfort, and feel sick to your stomach (nauseous) when the crushed (pulverized) fragments of stone are passed down the tube from the kidney to the bladder. Stone fragments can pass soon after the procedure and may last for up to 4 to 8 weeks.   A small number of patients may have severe pain when stone fragments are not able to pass, which leads to an obstruction.   If your stone is greater than 1 inch/2.5 centimeters in diameter or if you have multiple stones that have a combined diameter greater than 1 inch/2.5 centimeters, you may require more than 1 treatment.   You must have someone drive you home.  HOME CARE INSTRUCTIONS   Rest at home until you feel your  energy improving.   Only take over-the-counter or prescription medicines for pain, discomfort, or fever as directed by your caregiver. Depending on the type of lithotripsy, you may need to take medicines (antibiotics) that kill germs and anti-inflammatory medicines for a few days.   Drink enough water and fluids to keep your urine clear or pale yellow. This helps "flush" your kidneys. It helps pass any remaining pieces of stone and prevents stones from coming back.   Most people can resume daily activities within 1 or 2 days after standard lithotripsy. It can take longer to recover from laser and percutaneous lithotripsy.   If the stones are in your urinary system, you may be asked to strain your urine at home to look for stones. Any stones that are found can be sent to a medical lab for examination.   Visit your caregiver for a follow-up appointment in a few weeks. Your doctor may remove your stent if you have one. Your caregiver will also check to see whether stone particles still remain.  SEEK MEDICAL CARE IF:   You have an oral temperature above 102 F (38.9 C).   Your pain is not relieved by medicine.   You have a lasting nauseous feeling.   You feel there is too much blood in the urine.   You develop persistent problems with frequent and/or painful urination that does not at least partially improve after 2 days following the procedure.   You have a congested   cough.   You feel lightheaded.   You develop a rash or any other signs that might suggest an allergic problem.   You develop any reaction or side effects to your medicine(s).  SEEK IMMEDIATE MEDICAL CARE IF:   You experience severe back and/or flank pain.   You see nothing but blood when you urinate.   You cannot pass any urine at all.   You have an oral temperature above 102 F (38.9 C), not controlled by medicine.   You develop shortness of breath, difficulty breathing, or chest pain.   You develop vomiting  that will not stop after 6 to 8 hours.   You have a fainting episode.  MAKE SURE YOU:   Understand these instructions.   Will watch your condition.   Will get help right away if you are not doing well or get worse.  Document Released: 07/14/2007 Document Revised: 06/13/2011 Document Reviewed: 07/14/2007 Exit[employer] Patient Information 2012 [employer]. 

## 2011-11-07 NOTE — Telephone Encounter (Signed)
Received call from Dr.Nesi's nurse requesting something in writing for surgical clearance.Patient's office note from 11/07/11 faxed to her at fax # [phone].

## 2011-11-08 ENCOUNTER — Encounter (HOSPITAL_COMMUNITY): Payer: Self-pay

## 2011-11-08 NOTE — Op Note (Signed)
Refer to [facility] Op Note scanned in the chart 

## 2011-11-28 ENCOUNTER — Encounter: Payer: Self-pay | Admitting: *Deleted

## 2011-11-28 ENCOUNTER — Encounter: Payer: Self-pay | Admitting: Physician Assistant

## 2011-11-28 ENCOUNTER — Ambulatory Visit (INDEPENDENT_AMBULATORY_CARE_PROVIDER_SITE_OTHER): Payer: BC Managed Care – PPO | Admitting: Physician Assistant

## 2011-11-28 VITALS — BP 128/80 | HR 87 | Ht 64.0 in | Wt 286.0 lb

## 2011-11-28 DIAGNOSIS — R0989 Other specified symptoms and signs involving the circulatory and respiratory systems: Secondary | ICD-10-CM

## 2011-11-28 DIAGNOSIS — I4891 Unspecified atrial fibrillation: Secondary | ICD-10-CM

## 2011-11-28 DIAGNOSIS — R0683 Snoring: Secondary | ICD-10-CM

## 2011-11-28 NOTE — Patient Instructions (Addendum)
Your physician recommends that you schedule a follow-up appointment in: 5-6 weeks  Your physician recommends that you return for lab work around 6/17 (BMP & CBC)  Your physician has recommended that you have a sleep study. This test records several body functions during sleep, including: brain activity, eye movement, oxygen and carbon dioxide blood levels, heart rate and rhythm, breathing rate and rhythm, the flow of air through your mouth and nose, snoring, body muscle movements, and chest and belly movement.  Your physician has recommended that you have a Cardioversion (DCCV). Electrical Cardioversion uses a jolt of electricity to your heart either through paddles or wired patches attached to your chest. This is a controlled, usually prescheduled, procedure. Defibrillation is done under light anesthesia in the hospital, and you usually go home the day of the procedure. This is done to get your heart back into a normal rhythm. You are not awake for the procedure. Please see the instruction sheet given to you today.

## 2011-11-28 NOTE — Progress Notes (Signed)
[address] [place], [place]  [address] Phone: [phone] Fax:  [phone]  Date:  11/28/2011   Name:  Crystal Roth   DOB:  11/20/1950   MRN:  [id number]  PCP:  LUKING,W S, MD, MD  Primary Cardiologist/Primary Electrophysiologist:  Dr. Steven Klein    History of Present Illness: Crystal Roth is a 61 y.o. female who returns for follow up.  She was initially seen by Dr. Klein 11/07/11 with a new diagnosis of atrial fibrillation.  She was being eval for Lithotripsy for a kidney stone.  She has a CHADS2 score of 2 (HTN, DM).  Dr. Klein placed her on Apixaban with plans for DCCV after 3-4 weeks.  She returns for followup.  2D echocardiogram 11/06/11: - Left ventricle: The cavity size was normal. Wall thickness was increased in a pattern of mild LVH. Systolic function was normal. The estimated ejection fraction was in the range of 60% to 65%. Wall motion was normal; there were no regional wall motion abnormalities. Indeterminant diastolic function. - Aortic valve: There was no stenosis. - Mitral valve: No significant regurgitation. - Left atrium: The atrium was moderately dilated. - Right ventricle: The cavity size was normal. Systolic function was normal. - Right atrium: The atrium was mildly dilated. - Tricuspid valve: Peak RV-RA gradient: 24mm Hg (S). - Pulmonary arteries: PA peak pressure: 29mm Hg (S). - Inferior vena cava: The vessel was normal in size; the respirophasic diameter changes were in the normal range (= 50%); findings are consistent with normal central venous pressure.  Impressions: - The patient was in atrial fibrillation. Normal LV size with mild LV hypertrophy. EF 60-65%. Normal RV size and systolic function. Biatrial enlargement. No significant valvular abnormalities.  She had her lithotripsy performed and she is feeling better.  She denies chest pain, syncope, shortness of breath, orthopnea, PND or significant pedal edema.  She started her  Apixaban on 11/21/11.  No bleeding problems.   Wt Readings from Last 3 Encounters:  11/28/11 286 lb (129.729 kg)  11/07/11 292 lb 4 oz (132.564 kg)  11/07/11 292 lb 4 oz (132.564 kg)     Potassium  Date/Time Value Range Status  10/27/2011  9:50 PM 3.3* 3.5-5.1 (mEq/L) Final     Creatinine, Ser  Date/Time Value Range Status  10/27/2011  9:50 PM 0.96  0.50-1.10 (mg/dL) Final     ALT  Date/Time Value Range Status  10/27/2011  9:50 PM 40* 0-35 (U/L) Final     Hemoglobin  Date/Time Value Range Status  10/27/2011  9:50 PM 13.5  12.0-15.0 (g/dL) Final    Past Medical History  Diagnosis Date  . Diabetes mellitus   . Hypertension   . Reactive airway disease   . kidney calculus   . PONV (postoperative nausea and vomiting)   . Atrial fibrillation     dx 5/13; Apixaban; echo 5/13: mild LVH, EF 60-65%, mod LAe, mild RAE, PASP 29    Current Outpatient Prescriptions  Medication Sig Dispense Refill  . acetaminophen (TYLENOL) 500 MG tablet Take 500 mg by mouth every 6 (six) hours as needed. Pain       . albuterol (PROVENTIL HFA;VENTOLIN HFA) 108 (90 BASE) MCG/ACT inhaler Inhale 1-2 puffs into the lungs every 6 (six) hours as needed. Wheezing      . apixaban (ELIQUIS) 5 MG TABS tablet Take 5 mg by mouth 2 (two) times daily.      . benazepril-hydrochlorthiazide (LOTENSIN HCT) 20-25 MG per tablet Take   1 tablet by mouth daily before breakfast.       . metFORMIN (GLUCOPHAGE) 500 MG tablet Take 1,000 mg by mouth 2 (two) times daily with a meal. Take 2 tablets if bs >150      . ondansetron (ZOFRAN) 8 MG tablet Take 8 mg by mouth every 8 (eight) hours as needed.      . ondansetron (ZOFRAN-ODT) 8 MG disintegrating tablet Take 8 mg by mouth every 8 (eight) hours as needed. Nausea      . oxyCODONE-acetaminophen (PERCOCET) 5-325 MG per tablet as needed.      . OVER THE COUNTER MEDICATION Take 1 Package by mouth daily. melluca vitamin pack        Allergies: Allergies  Allergen Reactions  .  Nsaids Shortness Of Breath    History  Substance Use Topics  . Smoking status: Never Smoker   . Smokeless tobacco: Not on file  . Alcohol Use: No     ROS:  Please see the history of present illness.   She reports a h/o smoking and ? Daytime hypersomnolence.   All other systems reviewed and negative.   PHYSICAL EXAM: VS:  BP 128/80  Pulse 87  Ht 5' 4" (1.626 m)  Wt 286 lb (129.729 kg)  BMI 49.09 kg/m2 Well nourished, well developed, in no acute distress HEENT: normal Neck: no JVD Vascular: no carotid bruits Cardiac:  normal S1, S2; irregularly irregular rhythm; no murmur Lungs:  clear to auscultation bilaterally, no wheezing, rhonchi or rales Abd: soft, nontender, no hepatomegaly Ext: trace bilateral LE edema Skin: warm and dry Neuro:  CNs 2-12 intact, no focal abnormalities noted  EKG:  Atrial fibrillation, heart rate 87, normal axis, no change from prior tracing   ASSESSMENT AND PLAN:  1.  Atrial Fibrillation Persistent. She is now on Apixaban x 7 days. I will arrange elective DCCV in 3 weeks. Check BMET in 6 weeks. Of note, she is asymptomatic.  If she remains in AFib, it would be reasonable to pursue rate control strategy only.  2.  Hypertension Controlled.  Continue current therapy.   3.  Diabetes Managed by PCP.  4.  Nephrolithiasis Improved.   Signed, Usiel Astarita, PA-C  8:53 AM 11/28/2011    

## 2011-12-13 ENCOUNTER — Encounter (HOSPITAL_COMMUNITY): Payer: Self-pay | Admitting: Respiratory Therapy

## 2011-12-23 ENCOUNTER — Ambulatory Visit (HOSPITAL_BASED_OUTPATIENT_CLINIC_OR_DEPARTMENT_OTHER): Payer: BC Managed Care – PPO | Attending: Physician Assistant | Admitting: Radiology

## 2011-12-23 ENCOUNTER — Other Ambulatory Visit: Payer: BC Managed Care – PPO

## 2011-12-23 VITALS — Ht 64.0 in | Wt 273.0 lb

## 2011-12-23 DIAGNOSIS — G4733 Obstructive sleep apnea (adult) (pediatric): Secondary | ICD-10-CM | POA: Insufficient documentation

## 2011-12-23 DIAGNOSIS — I4891 Unspecified atrial fibrillation: Secondary | ICD-10-CM | POA: Insufficient documentation

## 2011-12-23 DIAGNOSIS — R0683 Snoring: Secondary | ICD-10-CM

## 2011-12-23 DIAGNOSIS — R259 Unspecified abnormal involuntary movements: Secondary | ICD-10-CM | POA: Insufficient documentation

## 2011-12-24 ENCOUNTER — Other Ambulatory Visit (INDEPENDENT_AMBULATORY_CARE_PROVIDER_SITE_OTHER): Payer: BC Managed Care – PPO

## 2011-12-24 DIAGNOSIS — I4891 Unspecified atrial fibrillation: Secondary | ICD-10-CM

## 2011-12-24 LAB — CBC WITH DIFFERENTIAL/PLATELET
Eosinophils Relative: 1.7 % (ref 0.0–5.0)
HCT: 40.5 % (ref 36.0–46.0)
Hemoglobin: 13.5 g/dL (ref 12.0–15.0)
Lymphocytes Relative: 45.6 % (ref 12.0–46.0)
Lymphs Abs: 2 10*3/uL (ref 0.7–4.0)
Monocytes Relative: 10.4 % (ref 3.0–12.0)
Platelets: 185 10*3/uL (ref 150.0–400.0)
WBC: 4.4 10*3/uL — ABNORMAL LOW (ref 4.5–10.5)

## 2011-12-24 LAB — BASIC METABOLIC PANEL
CO2: 30 mEq/L (ref 19–32)
Calcium: 8.9 mg/dL (ref 8.4–10.5)
Chloride: 102 mEq/L (ref 96–112)
Glucose, Bld: 124 mg/dL — ABNORMAL HIGH (ref 70–99)
Sodium: 142 mEq/L (ref 135–145)

## 2011-12-25 ENCOUNTER — Telehealth: Payer: Self-pay | Admitting: Internal Medicine

## 2011-12-25 DIAGNOSIS — I4891 Unspecified atrial fibrillation: Secondary | ICD-10-CM

## 2011-12-25 MED ORDER — POTASSIUM CHLORIDE CRYS ER 20 MEQ PO TBCR: 20.0000 meq | EXTENDED_RELEASE_TABLET | Freq: Every day | ORAL | Status: DC

## 2011-12-25 NOTE — Telephone Encounter (Signed)
New problem:  Cardioversion on 6/21. Receive a called from the office on yesterday regarding adjustment of medication.

## 2011-12-25 NOTE — Telephone Encounter (Signed)
Copied from lab result-- Notes Recorded by Beatrice Lecher, PA on 12/24/2011 at 1:23 PM K+ 20 mEq daily Repeat bmet in one week. Tereso Newcomer, PA-C 1:23 PM 12/24/2011      Pt notified.  Will send prescription to [facility] in [place].  She will come in January 02, 2012 for lab work

## 2011-12-27 ENCOUNTER — Encounter (HOSPITAL_COMMUNITY): Payer: Self-pay | Admitting: Anesthesiology

## 2011-12-27 ENCOUNTER — Ambulatory Visit (HOSPITAL_COMMUNITY): Payer: BC Managed Care – PPO | Admitting: Anesthesiology

## 2011-12-27 ENCOUNTER — Encounter (HOSPITAL_COMMUNITY)
Admission: RE | Disposition: A | Discharge status: Home / Self Care | Payer: Self-pay | Source: Ambulatory Visit | Attending: Internal Medicine

## 2011-12-27 ENCOUNTER — Ambulatory Visit (HOSPITAL_COMMUNITY)
Admission: RE | Admit: 2011-12-27 | Discharge: 2011-12-27 | Disposition: A | Discharge status: Home / Self Care | Payer: BC Managed Care – PPO | Source: Ambulatory Visit | Attending: Internal Medicine | Admitting: Internal Medicine

## 2011-12-27 DIAGNOSIS — I10 Essential (primary) hypertension: Secondary | ICD-10-CM | POA: Insufficient documentation

## 2011-12-27 DIAGNOSIS — Z01810 Encounter for preprocedural cardiovascular examination: Secondary | ICD-10-CM

## 2011-12-27 DIAGNOSIS — E119 Type 2 diabetes mellitus without complications: Secondary | ICD-10-CM | POA: Insufficient documentation

## 2011-12-27 DIAGNOSIS — I4891 Unspecified atrial fibrillation: Secondary | ICD-10-CM | POA: Insufficient documentation

## 2011-12-27 HISTORY — PX: CARDIOVERSION: SHX1299

## 2011-12-27 LAB — GLUCOSE, CAPILLARY: Glucose-Capillary: 107 mg/dL — ABNORMAL HIGH (ref 70–99)

## 2011-12-27 SURGERY — CARDIOVERSION
Anesthesia: General | Wound class: Clean

## 2011-12-27 MED ORDER — PROPOFOL 10 MG/ML IV BOLUS
INTRAVENOUS | Status: DC | PRN
  Administered 2011-12-27: 100 mg via INTRAVENOUS

## 2011-12-27 MED ORDER — LACTATED RINGERS IV SOLN
INTRAVENOUS | Status: DC | PRN
  Administered 2011-12-27: 09:00:00 via INTRAVENOUS

## 2011-12-27 NOTE — H&P (View-Only) (Signed)
[address] [place], [place]  [address] Phone: [phone] Fax:  [phone]  Date:  11/28/2011   Name:  Crystal Roth   DOB:  August 25, 1950   MRN:  [id number]  PCP:  Harlow Asa, MD, MD  Primary Cardiologist/Primary Electrophysiologist:  Dr. [name]    History of Present Illness: Crystal Roth is a 61 y.o. female who returns for follow up.  She was initially seen by Dr. Graciela Husbands 11/07/11 with a new diagnosis of atrial fibrillation.  She was being eval for Lithotripsy for a kidney stone.  She has a CHADS2 score of 2 (HTN, DM).  Dr. Graciela Husbands placed her on Apixaban with plans for DCCV after 3-4 weeks.  She returns for followup.  2D echocardiogram 11/06/11: - Left ventricle: The cavity size was normal. Wall thickness was increased in a pattern of mild LVH. Systolic function was normal. The estimated ejection fraction was in the range of 60% to 65%. Wall motion was normal; there were no regional wall motion abnormalities. Indeterminant diastolic function. - Aortic valve: There was no stenosis. - Mitral valve: No significant regurgitation. - Left atrium: The atrium was moderately dilated. - Right ventricle: The cavity size was normal. Systolic function was normal. - Right atrium: The atrium was mildly dilated. - Tricuspid valve: Peak RV-RA gradient: 24mm Hg (S). - Pulmonary arteries: PA peak pressure: 29mm Hg (S). - Inferior vena cava: The vessel was normal in size; the respirophasic diameter changes were in the normal range (= 50%); findings are consistent with normal central venous pressure.  Impressions: - The patient was in atrial fibrillation. Normal LV size with mild LV hypertrophy. EF 60-65%. Normal RV size and systolic function. Biatrial enlargement. No significant valvular abnormalities.  She had her lithotripsy performed and she is feeling better.  She denies chest pain, syncope, shortness of breath, orthopnea, PND or significant pedal edema.  She started her  Apixaban on 11/21/11.  No bleeding problems.   Wt Readings from Last 3 Encounters:  11/28/11 286 lb (129.729 kg)  11/07/11 292 lb 4 oz (132.564 kg)  11/07/11 292 lb 4 oz (132.564 kg)     Potassium  Date/Time Value Range Status  10/27/2011  9:50 PM 3.3* 3.5-5.1 (mEq/L) Final     Creatinine, Ser  Date/Time Value Range Status  10/27/2011  9:50 PM 0.96  0.50-1.10 (mg/dL) Final     ALT  Date/Time Value Range Status  10/27/2011  9:50 PM 40* 0-35 (U/L) Final     Hemoglobin  Date/Time Value Range Status  10/27/2011  9:50 PM 13.5  12.0-15.0 (g/dL) Final    Past Medical History  Diagnosis Date  . Diabetes mellitus   . Hypertension   . Reactive airway disease   . kidney calculus   . PONV (postoperative nausea and vomiting)   . Atrial fibrillation     dx 5/13; Apixaban; echo 5/13: mild LVH, EF 60-65%, mod LAe, mild RAE, PASP 29    Current Outpatient Prescriptions  Medication Sig Dispense Refill  . acetaminophen (TYLENOL) 500 MG tablet Take 500 mg by mouth every 6 (six) hours as needed. Pain       . albuterol (PROVENTIL HFA;VENTOLIN HFA) 108 (90 BASE) MCG/ACT inhaler Inhale 1-2 puffs into the lungs every 6 (six) hours as needed. Wheezing      . apixaban (ELIQUIS) 5 MG TABS tablet Take 5 mg by mouth 2 (two) times daily.      . benazepril-hydrochlorthiazide (LOTENSIN HCT) 20-25 MG per tablet Take  1 tablet by mouth daily before breakfast.       . metFORMIN (GLUCOPHAGE) 500 MG tablet Take 1,000 mg by mouth 2 (two) times daily with a meal. Take 2 tablets if bs >150      . ondansetron (ZOFRAN) 8 MG tablet Take 8 mg by mouth every 8 (eight) hours as needed.      . ondansetron (ZOFRAN-ODT) 8 MG disintegrating tablet Take 8 mg by mouth every 8 (eight) hours as needed. Nausea      . oxyCODONE-acetaminophen (PERCOCET) 5-325 MG per tablet as needed.      Marland Kitchen OVER THE COUNTER MEDICATION Take 1 Package by mouth daily. melluca vitamin pack        Allergies: Allergies  Allergen Reactions  .  Nsaids Shortness Of Breath    History  Substance Use Topics  . Smoking status: Never Smoker   . Smokeless tobacco: Not on file  . Alcohol Use: No     ROS:  Please see the history of present illness.   She reports a h/o smoking and ? Daytime hypersomnolence.   All other systems reviewed and negative.   PHYSICAL EXAM: VS:  BP 128/80  Pulse 87  Ht 5\' 4"  (1.626 m)  Wt 286 lb (129.729 kg)  BMI 49.09 kg/m2 Well nourished, well developed, in no acute distress HEENT: normal Neck: no JVD Vascular: no carotid bruits Cardiac:  normal S1, S2; irregularly irregular rhythm; no murmur Lungs:  clear to auscultation bilaterally, no wheezing, rhonchi or rales Abd: soft, nontender, no hepatomegaly Ext: trace bilateral LE edema Skin: warm and dry Neuro:  CNs 2-12 intact, no focal abnormalities noted  EKG:  Atrial fibrillation, heart rate 87, normal axis, no change from prior tracing   ASSESSMENT AND PLAN:  1.  Atrial Fibrillation Persistent. She is now on Apixaban x 7 days. I will arrange elective DCCV in 3 weeks. Check BMET in 6 weeks. Of note, she is asymptomatic.  If she remains in AFib, it would be reasonable to pursue rate control strategy only.  2.  Hypertension Controlled.  Continue current therapy.   3.  Diabetes Managed by PCP.  4.  Nephrolithiasis Improved.   Luna Glasgow, PA-C  8:53 AM 11/28/2011

## 2011-12-27 NOTE — Anesthesia Procedure Notes (Signed)
Procedure Name: MAC Date/Time: 12/27/2011 8:55 AM Performed by: Marena Chancy Pre-anesthesia Checklist: Patient identified, Emergency Drugs available, Suction available, Patient being monitored and Timeout performed Patient Re-evaluated:Patient Re-evaluated prior to inductionOxygen Delivery Method: Nasal cannula, Simple face mask and Ambu bag Preoxygenation: Pre-oxygenation with 100% oxygen Intubation Type: IV induction

## 2011-12-27 NOTE — Anesthesia Postprocedure Evaluation (Deleted)
  Anesthesia Post-op Note  Patient: Crystal Roth  Procedure(s) Performed: Procedure(s) (LRB): CARDIOVERSION (N/A)  Patient Location: PACU and Short Stay  Anesthesia Type: MAC and General  Level of Consciousness: awake, alert  and oriented  Airway and Oxygen Therapy: Patient Spontanous Breathing and Patient connected to nasal cannula oxygen  Post-op Pain: none  Post-op Assessment: Post-op Vital signs reviewed, Patient's Cardiovascular Status Stable, Respiratory Function Stable, Patent Airway, No signs of Nausea or vomiting, No headache and No backache  Post-op Vital Signs: Reviewed and stable  Complications: No apparent anesthesia complications

## 2011-12-27 NOTE — Transfer of Care (Signed)
Immediate Anesthesia Transfer of Care Note  Patient: Crystal Roth  Procedure(s) Performed: Procedure(s) (LRB): CARDIOVERSION (N/A)  Patient Location: PACU and Short Stay  Anesthesia Type: MAC and General  Level of Consciousness: awake, alert  and oriented  Airway & Oxygen Therapy: Patient Spontanous Breathing and Patient connected to nasal cannula oxygen  Post-op Assessment: Report given to PACU RN and Post -op Vital signs reviewed and stable  Post vital signs: Reviewed and stable  Complications: No apparent anesthesia complications

## 2011-12-27 NOTE — Anesthesia Postprocedure Evaluation (Signed)
  Anesthesia Post-op Note  Patient: Crystal Roth  Procedure(s) Performed: Procedure(s) (LRB): CARDIOVERSION (N/A)  Patient Location: PACU and Short Stay  Anesthesia Type: MAC and General  Level of Consciousness: awake, alert  and oriented  Airway and Oxygen Therapy: Patient Spontanous Breathing  Post-op Pain: none  Post-op Assessment: Post-op Vital signs reviewed, Patient's Cardiovascular Status Stable, Respiratory Function Stable and Patent Airway  Post-op Vital Signs: Reviewed and stable  Complications: No apparent anesthesia complications

## 2011-12-27 NOTE — Anesthesia Preprocedure Evaluation (Addendum)
Anesthesia Evaluation  Patient identified by MRN, date of birth, ID band Patient awake    Reviewed: Allergy & Precautions, H&P , NPO status , Patient's Chart, lab work & pertinent test results  History of Anesthesia Complications (+) PONV  Airway Mallampati: II      Dental  (+) Teeth Intact   Pulmonary          Cardiovascular hypertension, Pt. on medications + dysrhythmias Atrial Fibrillation     Neuro/Psych    GI/Hepatic   Endo/Other  Diabetes mellitus-, Well Controlled, Type 2  Renal/GU      Musculoskeletal   Abdominal   Peds  Hematology   Anesthesia Other Findings   Reproductive/Obstetrics                           Anesthesia Physical Anesthesia Plan  ASA: III  Anesthesia Plan: General and MAC   Post-op Pain Management:    Induction: Intravenous  Airway Management Planned: Mask  Additional Equipment:   Intra-op Plan:   Post-operative Plan:   Informed Consent: I have reviewed the patients History and Physical, chart, labs and discussed the procedure including the risks, benefits and alternatives for the proposed anesthesia with the patient or authorized representative who has indicated his/her understanding and acceptance.   Dental advisory given  Plan Discussed with:   Anesthesia Plan Comments:         Anesthesia Quick Evaluation

## 2011-12-27 NOTE — Discharge Instructions (Signed)
General Anesthetic, Adult A doctor specialized in giving anesthesia (anesthesiologist) or a nurse specialized in giving anesthesia (nurse anesthetist) gives medicine that makes you sleep while a procedure is performed (general anesthetic). Once the general anesthetic has been administered, you will be in a sleeplike state in which you feel no pain. After having a general anestheticyou may feel:   Dizzy.   Weak.   Drowsy.   Confused.  These feelings are normal and can be expected to last for up to 24 hours after the procedure is completed.  LET YOUR CAREGIVER KNOW ABOUT:  Allergies you have.   Medications you are taking, including herbs, eye drops, over the counter medications, dietary supplements, and creams.   Previous problems you have had with anesthetics or numbing medicines.   Use of cigarettes, alcohol, or illicit drugs.   Possibility of pregnancy, if this applies.   History of bleeding or blood disorders, including blood clots and clotting disorders.   Previous surgeries you have had and types of anesthetics you have received.   Family medical history, especially anesthetic problems.   Other health problems.  BEFORE THE PROCEDURE  You may brush your teeth on the morning of surgery but you should have no solid food or non-clear liquids for a minimum of 8 hours prior to your procedure. Clear liquids (water, black coffee, and tea) are acceptable in small amounts until 2 hours prior to your procedure.   You may take your regular medications the morning of your procedure unless your caregiver indicates otherwise.  AFTER THE PROCEDURE  After surgery, you will be taken to the recovery area where a nurse will monitor your progress. You will be allowed to go home when you are awake, stable, taking fluids well, and without serious pain or complications.   For the first 24 hours following an anesthetic:   Have a responsible person with you.   Do not drive a car. If you are  alone, do not take public transportation.   Do not engage in strenuous activity. You may usually resume normal activities the next day, or as advised by your caregiver.   Do not drink alcohol.   Do not take medicine that has not been prescribed by your caregiver.   Do not sign important papers or make important decisions as your judgement may be impaired.   You may resume a normal diet as directed.   Change bandages (dressings) as directed.   Only take over-the-counter or prescription medicines for pain, discomfort, or fever as directed by your caregiver.  If you have questions or problems that seem related to the anesthetic, call the hospital and ask for the anesthetist, anesthesiologist, or anesthesia department. SEEK IMMEDIATE MEDICAL CARE IF:   You develop a rash.   You have difficulty breathing.   You have chest pain.   You have allergic problems.   You have uncontrolled nausea.   You have uncontrolled vomiting.   You develop any serious bleeding, especially from the incision site.  Document Released: 10/01/2007 Document Revised: 06/13/2011 Document Reviewed: 10/25/2010 Exit[employer] Patient Information 2012 [employer]. 

## 2011-12-27 NOTE — Preoperative (Signed)
Beta Blockers   Reason not to administer Beta Blockers:Not Applicable 

## 2011-12-27 NOTE — Interval H&P Note (Signed)
History and Physical Interval Note:  12/27/2011 8:57 AM  Crystal Roth  has presented today for surgery, with the diagnosis of AFIB  The various methods of treatment have been discussed with the patient and family. After consideration of risks, benefits and other options for treatment, the patient has consented to  Procedure(s) (LRB): CARDIOVERSION (N/A) as a surgical intervention .  The patient's history has been reviewed, patient examined, no change in status, stable for surgery.  I have reviewed the patients' chart and labs.  Questions were answered to the patient's satisfaction.     [name]  Pt still in Afib; for DCCV this am Took  apixoban this am on schedule

## 2011-12-27 NOTE — CV Procedure (Signed)
Preop Dx afib Post op DX  NSR  Procedure  DC Cardioversion   Pt was sedated by anesthesia receiving 100 mg Propafol  A synchronized shock 120 joules failed to restore sinus rhythm A synchronized shock 200  joules restored sinus Rhythm   Pt tolerated without difficulty

## 2012-01-01 DIAGNOSIS — G4733 Obstructive sleep apnea (adult) (pediatric): Secondary | ICD-10-CM

## 2012-01-01 DIAGNOSIS — R259 Unspecified abnormal involuntary movements: Secondary | ICD-10-CM

## 2012-01-01 DIAGNOSIS — I4891 Unspecified atrial fibrillation: Secondary | ICD-10-CM

## 2012-01-01 NOTE — Procedures (Signed)
Crystal Roth, Crystal Roth                ACCOUNT NO.:  [id number]  MEDICAL RECORD NO.:  [id number]          PATIENT TYPE:  OUT  LOCATION:  SLEEP CENTER                 FACILITY[facility]  [facility]  PHYSICIAN:  Barbaraann Share, MD,FCCPDATE OF BIRTH:  1951/06/14  DATE OF STUDY:  12/23/2011                           NOCTURNAL POLYSOMNOGRAM  REFERRING PHYSICIAN:  Tereso Newcomer, PA-C  INDICATION FOR STUDY:  Hypersomnia with sleep apnea.  EPWORTH SLEEPINESS SCORE:  7.  SLEEP ARCHITECTURE:  The patient had total sleep time of 350 minutes with no slow-wave sleep and only 54 minutes of REM.  Sleep onset latency is normal at 31 minutes, and REM onset was prolonged at 150 minutes. Sleep efficiency is mildly reduced to 84%.  RESPIRATORY DATA:  The patient was found have 27 apneas and 42 obstructive hypopneas, giving her an apnea-hypopnea index of 12 events per hour.  The events occurred in all body positions, but were increased in REM.  Moderate snoring was noted throughout.  OXYGEN DATA:  There was O2 desaturation as low as 80% with the patient's obstructive event.  CARDIAC DATA:  The patient was noted to be in atrial fibrillation with a controlled ventricular response.  There was an occasional PVC noted.  MOVEMENTS/PARASOMNIA:  The patient had 674 leg jerks with 9 per hour resulting in arousal or awakening.  There were no behavioral abnormalities noted.  IMPRESSION/RECOMMENDATIONS: 1. Mild obstructive sleep apnea/hypopnea syndrome with an AHI of 12     events per hour and transient O2 desaturation as low as 80%.     Treatment for this degree of sleep apnea can include a trial of     weight loss alone, upper airway surgery, dental appliance, and also     CPAP.  Clinical correlation is suggested.  The decision to treat     this degree of sleep apnea aggressively should depend upon its     impact to the patient's quality of life. 2. Atrial fibrillation noted with a controlled ventricular response.  The patient also had occasional PVCs. 3. Very large numbers of periodic limb movements with what appears to     be significant sleep disruption.  Clinical correlation is suggested     to evaluate for a primary movement disorder of sleep.     Barbaraann Share, MD,FCCP Diplomate, American Board of Sleep Medicine    [facility]MED[facility]  D:  01/01/2012 08:05:21  T:  01/01/2012 08:49:06  Job:  [id number]

## 2012-01-02 ENCOUNTER — Encounter (HOSPITAL_COMMUNITY): Payer: Self-pay | Admitting: Internal Medicine

## 2012-01-02 ENCOUNTER — Other Ambulatory Visit (INDEPENDENT_AMBULATORY_CARE_PROVIDER_SITE_OTHER): Payer: BC Managed Care – PPO

## 2012-01-02 DIAGNOSIS — I4891 Unspecified atrial fibrillation: Secondary | ICD-10-CM

## 2012-01-02 LAB — BASIC METABOLIC PANEL
CO2: 29 mEq/L (ref 19–32)
Calcium: 9.4 mg/dL (ref 8.4–10.5)
Creatinine, Ser: 0.7 mg/dL (ref 0.4–1.2)
GFR: 93.7 mL/min (ref >60.00)
Sodium: 140 mEq/L (ref 135–145)

## 2012-01-08 ENCOUNTER — Encounter: Payer: Self-pay | Admitting: *Deleted

## 2012-01-08 ENCOUNTER — Ambulatory Visit (INDEPENDENT_AMBULATORY_CARE_PROVIDER_SITE_OTHER): Payer: BC Managed Care – PPO | Admitting: Internal Medicine

## 2012-01-08 ENCOUNTER — Encounter: Payer: Self-pay | Admitting: Internal Medicine

## 2012-01-08 VITALS — BP 126/90 | HR 83 | Ht 64.0 in | Wt 275.0 lb

## 2012-01-08 DIAGNOSIS — R9431 Abnormal electrocardiogram [ECG] [EKG]: Secondary | ICD-10-CM

## 2012-01-08 DIAGNOSIS — I4891 Unspecified atrial fibrillation: Secondary | ICD-10-CM

## 2012-01-08 MED ORDER — FLECAINIDE ACETATE 100 MG PO TABS: ORAL_TABLET | ORAL | Status: DC

## 2012-01-08 NOTE — Assessment & Plan Note (Signed)
The patient has recurrent atrial fibrillation is largely asymptomatic.  We discussed the potential role of sinus rhythm versus atrial fibrillation with the data from AFFIRM trial and value of maintained sinus rhythm versus atempted sinus rhythm.  We have elected thereafter to pursue a strategy of rhythm control. We will begin her on flecainide and undertake cardioversion.  With her abnormal electrocardiogram, will need stress Myoview scanning following cardioversion from coronary disease given the use of a 1C agent.

## 2012-01-08 NOTE — Patient Instructions (Addendum)
Your physician has recommended that you have a Cardioversion (DCCV). Electrical Cardioversion uses a jolt of electricity to your heart either through paddles or wired patches attached to your chest. This is a controlled, usually prescheduled, procedure. Defibrillation is done under light anesthesia in the hospital, and you usually go home the day of the procedure. This is done to get your heart back into a normal rhythm. You are not awake for the procedure. Please see the instruction sheet given to you today.  Your physician has recommended you make the following change in your medication:  1) Start flecainide 100 mg 1/2 tablet by mouth twice daily for 3 days, then increase to a whole tablet by mouth twice daily.  Your physician has requested that you have an exercise stress myoview. For further information please visit [email or web address] Please follow instruction sheet, as given.  Your physician recommends that you schedule a follow-up appointment in: 6 weeks.

## 2012-01-08 NOTE — Progress Notes (Signed)
  HPI  Crystal Roth is a 61 y.o. female sseeen in followup for atrial fibrillation;  Echo demonstrated by atrial enlargement  In May 2013 she was started on apixoban and underwent cardioversion 2 weeks ago>>now AF  Sleep study >>Mild OSA  She has no associated symptoms  Past Medical History  Diagnosis Date  . Diabetes mellitus   . Hypertension   . Reactive airway disease   . kidney calculus   . PONV (postoperative nausea and vomiting)   . Atrial fibrillation     dx 5/13; Apixaban; echo 5/13: mild LVH, EF 60-65%, mod LAe, mild RAE, PASP 29    Past Surgical History  Procedure Date  . Abdominal hysterectomy   . Foot surgery   . Dilation and curettage of uterus   . Tubal ligation   . Cardioversion 12/27/2011    Procedure: CARDIOVERSION;  Surgeon: Duke Salvia, MD;  Location: [facility] OR;  Service: Cardiovascular;  Laterality: N/A;    Current Outpatient Prescriptions  Medication Sig Dispense Refill  . acetaminophen (TYLENOL) 500 MG tablet Take 500 mg by mouth every 6 (six) hours as needed. Pain       . albuterol (PROVENTIL HFA;VENTOLIN HFA) 108 (90 BASE) MCG/ACT inhaler Inhale 1-2 puffs into the lungs every 6 (six) hours as needed. Wheezing      . allopurinol (ZYLOPRIM) 100 MG tablet Take 100 mg by mouth daily.       Marland Kitchen apixaban (ELIQUIS) 5 MG TABS tablet Take 5 mg by mouth 2 (two) times daily.      . benazepril-hydrochlorthiazide (LOTENSIN HCT) 20-25 MG per tablet Take 1 tablet by mouth daily before breakfast.       . metFORMIN (GLUCOPHAGE) 500 MG tablet Take 1,000 mg by mouth 2 (two) times daily with a meal. Take 2 tablets if bs >150      . OVER THE COUNTER MEDICATION Take 1 Package by mouth daily. melluca vitamin pack      . oxyCODONE-acetaminophen (PERCOCET) 5-325 MG per tablet Take 1 tablet by mouth as needed. For pain      . potassium chloride SA (K-DUR,KLOR-CON) 20 MEQ tablet Take 1 tablet (20 mEq total) by mouth daily.  30 tablet  6    Allergies  Allergen Reactions  .  Nsaids Shortness Of Breath    Review of Systems negative except from HPI and PMH  Physical Exam BP 126/90  Pulse 83  Ht 5\' 4"  (1.626 m)  Wt 275 lb (124.739 kg)  BMI 47.20 kg/m2 Well developed and well nourished in no acute distress HENT normal E scleral and icterus clear Neck Supple JVP flat; carotids brisk and full Clear to ausculation irregularly irregular  no murmurs gallops or rub Soft with active bowel sounds No clubbing cyanosis none Edema Alert and oriented, grossly normal motor and sensory function Skin Warm and Dry  ECG demonstrates atrial fibrillation 3 Intervals-/09/29\ Low voltage Possible septal infarct Assessment and  Plan

## 2012-01-13 ENCOUNTER — Encounter (HOSPITAL_COMMUNITY): Payer: Self-pay | Admitting: Respiratory Therapy

## 2012-01-17 ENCOUNTER — Ambulatory Visit (HOSPITAL_COMMUNITY)
Admission: RE | Admit: 2012-01-17 | Discharge: 2012-01-17 | Disposition: A | Discharge status: Home / Self Care | Payer: BC Managed Care – PPO | Source: Ambulatory Visit | Attending: Cardiovascular Disease | Admitting: Cardiovascular Disease

## 2012-01-17 ENCOUNTER — Encounter (HOSPITAL_COMMUNITY): Payer: Self-pay | Admitting: *Deleted

## 2012-01-17 ENCOUNTER — Encounter (HOSPITAL_COMMUNITY): Payer: Self-pay | Admitting: Certified Registered"

## 2012-01-17 ENCOUNTER — Ambulatory Visit (HOSPITAL_COMMUNITY): Payer: BC Managed Care – PPO | Admitting: Certified Registered"

## 2012-01-17 ENCOUNTER — Encounter (HOSPITAL_COMMUNITY)
Admission: RE | Disposition: A | Discharge status: Home / Self Care | Payer: Self-pay | Source: Ambulatory Visit | Attending: Cardiovascular Disease

## 2012-01-17 DIAGNOSIS — E119 Type 2 diabetes mellitus without complications: Secondary | ICD-10-CM | POA: Insufficient documentation

## 2012-01-17 DIAGNOSIS — I10 Essential (primary) hypertension: Secondary | ICD-10-CM | POA: Insufficient documentation

## 2012-01-17 DIAGNOSIS — I4891 Unspecified atrial fibrillation: Secondary | ICD-10-CM | POA: Insufficient documentation

## 2012-01-17 DIAGNOSIS — J45909 Unspecified asthma, uncomplicated: Secondary | ICD-10-CM | POA: Insufficient documentation

## 2012-01-17 HISTORY — PX: CARDIOVERSION: SHX1299

## 2012-01-17 LAB — CBC
MCHC: 34 g/dL (ref 30.0–36.0)
Platelets: 201 10*3/uL (ref 150–400)
RDW: 14.1 % (ref 11.5–15.5)

## 2012-01-17 LAB — BASIC METABOLIC PANEL
GFR calc Af Amer: >90 mL/min (ref >90)
GFR calc non Af Amer: >90 mL/min (ref >90)
Potassium: 3.4 mEq/L — ABNORMAL LOW (ref 3.5–5.1)
Sodium: 140 mEq/L (ref 135–145)

## 2012-01-17 LAB — GLUCOSE, CAPILLARY: Glucose-Capillary: 102 mg/dL — ABNORMAL HIGH (ref 70–99)

## 2012-01-17 SURGERY — CARDIOVERSION
Anesthesia: General | Wound class: Clean

## 2012-01-17 MED ORDER — SODIUM CHLORIDE 0.9 % IV SOLN
INTRAVENOUS | Status: DC | PRN
  Administered 2012-01-17: 13:00:00 via INTRAVENOUS

## 2012-01-17 MED ORDER — PROPOFOL 10 MG/ML IV EMUL
INTRAVENOUS | Status: DC | PRN
  Administered 2012-01-17: 70 mg via INTRAVENOUS

## 2012-01-17 NOTE — Anesthesia Postprocedure Evaluation (Signed)
  Anesthesia Post-op Note  Patient: Crystal Roth  Procedure(s) Performed: Procedure(s) (LRB): CARDIOVERSION (N/A)  Patient Location: PACU and Short Stay  Anesthesia Type: General  Level of Consciousness: awake, alert  and oriented  Airway and Oxygen Therapy: Patient Spontanous Breathing  Post-op Pain: none  Post-op Assessment: Post-op Vital signs reviewed  Post-op Vital Signs: stable  Complications: No apparent anesthesia complications 

## 2012-01-17 NOTE — H&P (View-Only) (Signed)
  HPI  Crystal Roth is a 61 y.o. female sseeen in followup for atrial fibrillation;  Echo demonstrated by atrial enlargement  In May 2013 she was started on apixoban and underwent cardioversion 2 weeks ago>>now AF  Sleep study >>Mild OSA  She has no associated symptoms  Past Medical History  Diagnosis Date  . Diabetes mellitus   . Hypertension   . Reactive airway disease   . kidney calculus   . PONV (postoperative nausea and vomiting)   . Atrial fibrillation     dx 5/13; Apixaban; echo 5/13: mild LVH, EF 60-65%, mod LAe, mild RAE, PASP 29    Past Surgical History  Procedure Date  . Abdominal hysterectomy   . Foot surgery   . Dilation and curettage of uterus   . Tubal ligation   . Cardioversion 12/27/2011    Procedure: CARDIOVERSION;  Surgeon: Steven C Klein, MD;  Location: [facility] OR;  Service: Cardiovascular;  Laterality: N/A;    Current Outpatient Prescriptions  Medication Sig Dispense Refill  . acetaminophen (TYLENOL) 500 MG tablet Take 500 mg by mouth every 6 (six) hours as needed. Pain       . albuterol (PROVENTIL HFA;VENTOLIN HFA) 108 (90 BASE) MCG/ACT inhaler Inhale 1-2 puffs into the lungs every 6 (six) hours as needed. Wheezing      . allopurinol (ZYLOPRIM) 100 MG tablet Take 100 mg by mouth daily.       . apixaban (ELIQUIS) 5 MG TABS tablet Take 5 mg by mouth 2 (two) times daily.      . benazepril-hydrochlorthiazide (LOTENSIN HCT) 20-25 MG per tablet Take 1 tablet by mouth daily before breakfast.       . metFORMIN (GLUCOPHAGE) 500 MG tablet Take 1,000 mg by mouth 2 (two) times daily with a meal. Take 2 tablets if bs >150      . OVER THE COUNTER MEDICATION Take 1 Package by mouth daily. melluca vitamin pack      . oxyCODONE-acetaminophen (PERCOCET) 5-325 MG per tablet Take 1 tablet by mouth as needed. For pain      . potassium chloride SA (K-DUR,KLOR-CON) 20 MEQ tablet Take 1 tablet (20 mEq total) by mouth daily.  30 tablet  6    Allergies  Allergen Reactions  .  Nsaids Shortness Of Breath    Review of Systems negative except from HPI and PMH  Physical Exam BP 126/90  Pulse 83  Ht 5' 4" (1.626 m)  Wt 275 lb (124.739 kg)  BMI 47.20 kg/m2 Well developed and well nourished in no acute distress HENT normal E scleral and icterus clear Neck Supple JVP flat; carotids brisk and full Clear to ausculation irregularly irregular  no murmurs gallops or rub Soft with active bowel sounds No clubbing cyanosis none Edema Alert and oriented, grossly normal motor and sensory function Skin Warm and Dry  ECG demonstrates atrial fibrillation 3 Intervals-/09/29\ Low voltage Possible septal infarct Assessment and  Plan  

## 2012-01-17 NOTE — Anesthesia Postprocedure Evaluation (Signed)
  Anesthesia Post-op Note  Patient: Crystal Roth  Procedure(s) Performed: Procedure(s) (LRB): CARDIOVERSION (N/A)  Patient Location: PACU and Short Stay  Anesthesia Type: General  Level of Consciousness: awake, alert  and oriented  Airway and Oxygen Therapy: Patient Spontanous Breathing  Post-op Pain: none  Post-op Assessment: Post-op Vital signs reviewed  Post-op Vital Signs: stable  Complications: No apparent anesthesia complications

## 2012-01-17 NOTE — CV Procedure (Signed)
    Cardioversion Note  Crystal Roth [id number] 1951/06/12  Procedure: DC Cardioversion Indications: Recurrent Atrial Fibrillation  Procedure Details Consent: Obtained Time Out: Verified patient identification, verified procedure, site/side was marked, verified correct patient position, special equipment/implants available, Radiology Safety Procedures followed,  medications/allergies/relevent history reviewed, required imaging and test results available.  Performed  The patient has been on adequate anticoagulation.  The patient received IV Propofol 70 mg  for sedation.  Synchronous cardioversion was performed at 200 joules.  The cardioversion was successful    Complications: No apparent complications Patient did tolerate procedure well.   [name], Montez Hageman., MD, [facility] 01/17/2012, 1:18 PM

## 2012-01-17 NOTE — Interval H&P Note (Signed)
History and Physical Interval Note:  01/17/2012 1:09 PM  Crystal Roth  has presented today for surgery, with the diagnosis of AFIB  The various methods of treatment have been discussed with the patient and family. After consideration of risks, benefits and other options for treatment, the patient has consented to  Procedure(s) (LRB): CARDIOVERSION (N/A) as a surgical intervention .  The patient's history has been reviewed, patient examined, no change in status, stable for surgery.  I have reviewed the patients' chart and labs.  Questions were answered to the patient's satisfaction.     [name].

## 2012-01-17 NOTE — Transfer of Care (Signed)
Immediate Anesthesia Transfer of Care Note  Patient: Crystal Roth  Procedure(s) Performed: Procedure(s) (LRB): CARDIOVERSION (N/A)  Patient Location: PACU and Short Stay  Anesthesia Type: General  Level of Consciousness: awake, alert  and oriented  Airway & Oxygen Therapy: Patient Spontanous Breathing  Post-op Assessment: Report given to PACU RN  Post vital signs: stable  Complications: No apparent anesthesia complications

## 2012-01-17 NOTE — Anesthesia Postprocedure Evaluation (Signed)
  Anesthesia Post-op Note  Patient: Crystal Roth  Procedure(s) Performed: Procedure(s) (LRB): CARDIOVERSION (N/A)  Patient Location: PACU  Anesthesia Type: General  Level of Consciousness: awake, alert  and oriented  Airway and Oxygen Therapy: Patient Spontanous Breathing and Patient connected to nasal cannula oxygen  Post-op Pain: none  Post-op Assessment: Post-op Vital signs reviewed and Patient's Cardiovascular Status Stable  Post-op Vital Signs: stable  Complications: No apparent anesthesia complications

## 2012-01-17 NOTE — Anesthesia Preprocedure Evaluation (Addendum)
Anesthesia Evaluation  Patient identified by MRN, date of birth, ID band Patient awake    Reviewed: Allergy & Precautions, H&P , NPO status , Patient's Chart, lab work & pertinent test results, reviewed documented beta blocker date and time   History of Anesthesia Complications (+) PONV  Airway Mallampati: I TM Distance: <3 FB     Dental  (+) Teeth Intact   Pulmonary  breath sounds clear to auscultation        Cardiovascular hypertension, Pt. on medications + dysrhythmias Atrial Fibrillation Rhythm:Irregular Rate:Normal     Neuro/Psych    GI/Hepatic   Endo/Other  Well Controlled, Type 2  Renal/GU      Musculoskeletal   Abdominal (+)  Abdomen: soft. Bowel sounds: normal.  Peds  Hematology   Anesthesia Other Findings   Reproductive/Obstetrics                         Anesthesia Physical Anesthesia Plan  ASA: III  Anesthesia Plan: General   Post-op Pain Management:    Induction: Intravenous  Airway Management Planned: Mask  Additional Equipment:   Intra-op Plan:   Post-operative Plan:   Informed Consent: I have reviewed the patients History and Physical, chart, labs and discussed the procedure including the risks, benefits and alternatives for the proposed anesthesia with the patient or authorized representative who has indicated his/her understanding and acceptance.   Dental advisory given  Plan Discussed with: CRNA and Surgeon  Anesthesia Plan Comments:        Anesthesia Quick Evaluation

## 2012-01-20 ENCOUNTER — Encounter (HOSPITAL_COMMUNITY): Payer: Self-pay | Admitting: Internal Medicine

## 2012-01-22 ENCOUNTER — Ambulatory Visit (HOSPITAL_COMMUNITY): Payer: BC Managed Care – PPO | Attending: Cardiology | Admitting: Radiology

## 2012-01-22 VITALS — BP 100/57 | HR 57 | Ht 64.0 in | Wt 268.0 lb

## 2012-01-22 DIAGNOSIS — R0602 Shortness of breath: Secondary | ICD-10-CM

## 2012-01-22 DIAGNOSIS — R0609 Other forms of dyspnea: Secondary | ICD-10-CM | POA: Insufficient documentation

## 2012-01-22 DIAGNOSIS — I4891 Unspecified atrial fibrillation: Secondary | ICD-10-CM

## 2012-01-22 DIAGNOSIS — E669 Obesity, unspecified: Secondary | ICD-10-CM | POA: Insufficient documentation

## 2012-01-22 DIAGNOSIS — I517 Cardiomegaly: Secondary | ICD-10-CM | POA: Insufficient documentation

## 2012-01-22 DIAGNOSIS — R0989 Other specified symptoms and signs involving the circulatory and respiratory systems: Secondary | ICD-10-CM | POA: Insufficient documentation

## 2012-01-22 DIAGNOSIS — I10 Essential (primary) hypertension: Secondary | ICD-10-CM | POA: Insufficient documentation

## 2012-01-22 DIAGNOSIS — E119 Type 2 diabetes mellitus without complications: Secondary | ICD-10-CM | POA: Insufficient documentation

## 2012-01-22 DIAGNOSIS — R9431 Abnormal electrocardiogram [ECG] [EKG]: Secondary | ICD-10-CM

## 2012-01-22 MED ORDER — TECHNETIUM TC 99M TETROFOSMIN IV KIT
30.0000 | PACK | Freq: Once | INTRAVENOUS | Status: AC | PRN
  Administered 2012-01-22: 30 via INTRAVENOUS

## 2012-01-22 NOTE — Progress Notes (Signed)
[facility] SITE 3 NUCLEAR MED [address] [place] [address] [phone]  Cardiology Nuclear Med Study  Crystal Roth is a 61 y.o. female     MRN : [id number]     DOB: January 23, 1951  Procedure Date: 01/22/2012  Nuclear Med Background Indication for Stress Test:  Evaluation for Ischemia and Follow-Up Cardioversion and Anti-rhythmic Drug History:  11/06/11 Echo:EF=60-65%, atrial enlargement; 6/13 & 01/17/12 Cardioversions. Cardiac Risk Factors: Hypertension, NIDDM and Obesity  Symptoms:  DOE and felt "jittery" on Monday, 01/20/12.   Nuclear Pre-Procedure Caffeine/Decaff Intake:  None > 12 hrs NPO After: 9:00pm   Lungs:  clear O2 Sat: 95% on room air. IV 0.9% NS with Angio Cath:  20g  IV Site: R Wrist x 1, tolerated well IV Started by:  Irean Hong, RN  Chest Size (in):  44 Cup Size: DD  Height: 5\' 4"  (1.626 m)  Weight:  268 lb (121.564 kg)  BMI:  Body mass index is 46.00 kg/(m^2). Tech Comments:  Flecainide taken this am    Nuclear Med Study 1 or 2 day study: 2 day  Stress Test Type:  Stress  Reading MD: Marca Ancona, MD  Order Authorizing Provider:  [name], MD  Resting Radionuclide: Technetium 69m Tetrofosmin  Resting Radionuclide Dose: 33.0 mCi on 01/23/12   Stress Radionuclide:  Technetium 48m Tetrofosmin  Stress Radionuclide Dose: 33.0 mCi on 01/22/12           Stress Protocol Rest HR: 64 Stress HR: 150  Rest BP: 100/57 Stress BP: 186/79  Exercise Time (min): 4:00 METS: 5.8   Predicted Max HR: 160 bpm % Max HR: 93.75 bpm Rate Pressure Product: 46962   Dose of Adenosine (mg):  n/a Dose of Lexiscan: n/a mg  Dose of Atropine (mg): n/a Dose of Dobutamine: n/a mcg/kg/min (at max HR)  Stress Test Technologist: Smiley Houseman, CMA-N  Nuclear Technologist:  Domenic Polite, CNMT     Rest Procedure:  Myocardial perfusion imaging was performed at rest 45 minutes following the intravenous administration of Technetium 3m Tetrofosmin.  Rest ECG:  Sinus rhythm with no acute changes.  Stress Procedure:  The patient exercised on the treadmill utilizing the Bruce protocol for four minutes. She then stopped due to fatigue and dyspnea.  She denied any chest pain.  There were nonspecific ST-T wave changes and occasional PVC's noted.  Technetium 108m Tetrofosmin was injected at peak exercise and myocardial perfusion imaging was performed after a brief delay.  Stress ECG: 1 mm ST segment depression at peak exercise and recovery with PVC;s  QPS Raw Data Images:  Normal; no motion artifact; normal heart/lung ratio. Stress Images:  Normal homogeneous uptake in all areas of the myocardium. Rest Images:  Normal homogeneous uptake in all areas of the myocardium. Subtraction (SDS):  Normal Transient Ischemic Dilatation (Normal <1.22):  0.84 Lung/Heart Ratio (Normal <0.45):  0.37  Quantitative Gated Spect Images QGS EDV:  102 ml QGS ESV:  27 ml  Impression Exercise Capacity:  Poor exercise capacity. BP Response:  Normal blood pressure response. Clinical Symptoms:  There is dyspnea. ECG Impression:  1 mm ST segment depression in inferolateral leads Comparison with Prior Nuclear Study: No previous nuclear study performed  Overall Impression:  Low risk stress nuclear study.  Nuclear images are normal.  Positive ECG response  LV Ejection Fraction: 74%.  LV Wall Motion:  NL LV Function; NL Wall Motion   Charlton Haws

## 2012-01-23 ENCOUNTER — Ambulatory Visit (HOSPITAL_COMMUNITY): Payer: BC Managed Care – PPO | Attending: Cardiovascular Disease

## 2012-01-23 DIAGNOSIS — R0989 Other specified symptoms and signs involving the circulatory and respiratory systems: Secondary | ICD-10-CM

## 2012-01-23 MED ORDER — TECHNETIUM TC 99M TETROFOSMIN IV KIT
33.0000 | PACK | Freq: Once | INTRAVENOUS | Status: AC | PRN
  Administered 2012-01-23: 33 via INTRAVENOUS

## 2012-02-11 ENCOUNTER — Telehealth: Payer: Self-pay | Admitting: Internal Medicine

## 2012-02-20 ENCOUNTER — Encounter: Payer: Self-pay | Admitting: Internal Medicine

## 2012-02-20 ENCOUNTER — Ambulatory Visit (INDEPENDENT_AMBULATORY_CARE_PROVIDER_SITE_OTHER): Payer: BC Managed Care – PPO | Admitting: Internal Medicine

## 2012-02-20 VITALS — BP 122/78 | HR 70 | Ht 64.5 in | Wt 264.4 lb

## 2012-02-20 DIAGNOSIS — I4891 Unspecified atrial fibrillation: Secondary | ICD-10-CM

## 2012-02-20 NOTE — Assessment & Plan Note (Signed)
Maintaining sinus rhythm on flecainide. She is tolerating her apixoban As her anticoagulants. We'll see her in 6 months

## 2012-02-20 NOTE — Progress Notes (Signed)
  HPI  Crystal Roth is a 61 y.o. female Seen in followup for atrial fibrillation treated with apixoban for anticoagulation and for which cardioversion was undertaken without antiarrhythmic support. This was only borderline affective and she was started on flecainide and underwent repeat cardioversion in early July 2013. Preprocedure Myoview scanning had demonstrated normal left ventricular function and no ischemia  She is maintaining sinus rhythm and is quite pleased with this. She's had no side effects attributable to her atrial fibrillation Past Medical History  Diagnosis Date  . Diabetes mellitus   . Hypertension   . Reactive airway disease   . PONV (postoperative nausea and vomiting)   . Atrial fibrillation     dx 5/13; Apixaban; echo 5/13: mild LVH, EF 60-65%, mod LAe, mild RAE, PASP 29  . kidney calculus     Past Surgical History  Procedure Date  . Abdominal hysterectomy   . Foot surgery   . Dilation and curettage of uterus   . Tubal ligation   . Cardioversion 12/27/2011    Procedure: CARDIOVERSION;  Surgeon: Duke Salvia, MD;  [facility]: [facility];  Service: Cardiovascular;  Laterality: N/A;  . Cardioversion 01/17/2012    Procedure: CARDIOVERSION;  Surgeon: Duke Salvia, MD;  Location: [facility] OR;  Service: Cardiovascular;  Laterality: N/A;    Current Outpatient Prescriptions  Medication Sig Dispense Refill  . acetaminophen (TYLENOL) 500 MG tablet Take 500 mg by mouth every 6 (six) hours as needed. Pain       . albuterol (PROVENTIL HFA;VENTOLIN HFA) 108 (90 BASE) MCG/ACT inhaler Inhale 1-2 puffs into the lungs every 6 (six) hours as needed. Wheezing      . allopurinol (ZYLOPRIM) 100 MG tablet Take 100 mg by mouth daily.       Marland Kitchen apixaban (ELIQUIS) 5 MG TABS tablet Take 5 mg by mouth 2 (two) times daily.      . benazepril-hydrochlorthiazide (LOTENSIN HCT) 20-25 MG per tablet Take 1 tablet by mouth daily before breakfast.       . flecainide (TAMBOCOR) 100 MG tablet Take 100 mg by  mouth 2 (two) times daily.      . metFORMIN (GLUCOPHAGE) 500 MG tablet Take 1,000 mg by mouth 2 (two) times daily with a meal. Take 2 tablets if bs >150      . OVER THE COUNTER MEDICATION Take 1 Package by mouth daily. melluca vitamin pack      . potassium chloride SA (K-DUR,KLOR-CON) 20 MEQ tablet Take 1 tablet (20 mEq total) by mouth daily.  30 tablet  6    Allergies  Allergen Reactions  . Nsaids Shortness Of Breath    Review of Systems negative except from HPI and PMH  Physical Exam BP 122/78  Pulse 70  Ht 5' 4.5" (1.638 m)  Wt 264 lb 6.4 oz (119.931 kg)  BMI 44.68 kg/m2 Well developed and nourished in no acute distress HENT normal Neck supple with JVP-flat Clear Regular rate and rhythm, no murmurs or gallops Abd-soft with active BS No Clubbing cyanosis edema Skin-warm and dry A & Oriented  Grossly normal sensory and motor function  Electrocardiogram demonstrates sinus rhythm at 68 Intervals 21/10/45 Axis 18  Assessment and  Plan

## 2012-02-20 NOTE — Patient Instructions (Signed)
Your physician wants you to follow-up in: 6 months with Dr. Klein. You will receive a reminder letter in the mail two months in advance. If you don't receive a letter, please call our office to schedule the follow-up appointment.  Your physician recommends that you continue on your current medications as directed. Please refer to the Current Medication list given to you today.  

## 2012-07-22 ENCOUNTER — Other Ambulatory Visit: Payer: Self-pay | Admitting: Physician Assistant

## 2012-08-12 ENCOUNTER — Other Ambulatory Visit: Payer: Self-pay | Admitting: Internal Medicine

## 2012-08-18 ENCOUNTER — Ambulatory Visit (INDEPENDENT_AMBULATORY_CARE_PROVIDER_SITE_OTHER): Payer: BC Managed Care – PPO | Admitting: Internal Medicine

## 2012-08-18 ENCOUNTER — Encounter: Payer: Self-pay | Admitting: Internal Medicine

## 2012-08-18 VITALS — BP 137/72 | HR 91 | Ht 66.0 in | Wt 284.0 lb

## 2012-08-18 DIAGNOSIS — I4891 Unspecified atrial fibrillation: Secondary | ICD-10-CM

## 2012-08-18 NOTE — Progress Notes (Signed)
Patient Care Team: Merlyn Albert, MD as PCP - General (Family Medicine)   HPI  Crystal Roth is a 62 y.o. female Seen in followup for atrial fibrillation treated with apixoban for anticoagulation and for which cardioversion was undertaken without antiarrhythmic support. This was only borderline affective and she was started on flecainide and underwent repeat cardioversion in early July 2013. Preprocedure Myoview scanning had demonstrated normal left ventricular function and no ischemia   She has just recently and were the death of her mother in the context of Parkinson's and recurrent aspiration. She is, to her credit, trying to work through this with a Veterinary surgeon. She's had no intercurrent atrial fibrillation of which she is aware. She denies chest pain or shortness of breath.  Past Medical History  Diagnosis Date  . Diabetes mellitus   . Hypertension   . Reactive airway disease   . PONV (postoperative nausea and vomiting)   . Atrial fibrillation     dx 5/13; Apixaban; echo 5/13: mild LVH, EF 60-65%, mod LAe, mild RAE, PASP 29  . kidney calculus     Past Surgical History  Procedure Laterality Date  . Abdominal hysterectomy    . Foot surgery    . Dilation and curettage of uterus    . Tubal ligation    . Cardioversion  12/27/2011    Procedure: CARDIOVERSION;  Surgeon: Duke Salvia, MD;  Location: [facility] OR;  Service: Cardiovascular;  Laterality: N/A;  . Cardioversion  01/17/2012    Procedure: CARDIOVERSION;  Surgeon: Duke Salvia, MD;  Location[facility] OR;  Service: Cardiovascular;  Laterality: N/A;    Current Outpatient Prescriptions  Medication Sig Dispense Refill  . acetaminophen (TYLENOL) 500 MG tablet Take 500 mg by mouth every 6 (six) hours as needed. Pain       . albuterol (PROVENTIL HFA;VENTOLIN HFA) 108 (90 BASE) MCG/ACT inhaler Inhale 1-2 puffs into the lungs every 6 (six) hours as needed. Wheezing      . allopurinol (ZYLOPRIM) 100 MG tablet Take 100 mg by mouth daily.        Marland Kitchen apixaban (ELIQUIS) 5 MG TABS tablet Take 5 mg by mouth 2 (two) times daily.      . benazepril-hydrochlorthiazide (LOTENSIN HCT) 20-25 MG per tablet Take 1 tablet by mouth daily before breakfast.       . cefdinir (OMNICEF) 300 MG capsule Take 300 mg by mouth 2 (two) times daily.      . flecainide (TAMBOCOR) 100 MG tablet Take 100 mg by mouth 2 (two) times daily.      . flecainide (TAMBOCOR) 100 MG tablet TAKE ONE-HALF TABLET BY MOUTH TWICE DAILY FOR 3 DAYS, THEN INCREASE TO A WHOLE TABLET BY MOUTH TWICE DAILY  60 tablet  5  . KLOR-CON M20 20 MEQ tablet TAKE ONE TABLET BY MOUTH EVERY DAY  30 tablet  1  . metFORMIN (GLUCOPHAGE) 500 MG tablet Take 1,000 mg by mouth 2 (two) times daily with a meal. Take 2 tablets if bs >150      . OVER THE COUNTER MEDICATION Take 1 Package by mouth daily. melluca vitamin pack       No current facility-administered medications for this visit.    Allergies  Allergen Reactions  . Nsaids Shortness Of Breath    Review of Systems negative except from HPI and PMH  Physical Exam BP 137/72  Pulse 91  Ht 5\' 6"  (1.676 m)  Wt 284 lb (128.822 kg)  BMI 45.86 kg/m2  SpO2  96% Well developed and well nourished in no acute distress HENT normal E scleral and icterus clear Neck Supple JVP flat; carotids brisk and full Clear to ausculation  Regular rate and rhythm, no murmurs gallops or rub Soft with active bowel sounds No clubbing cyanosis none Edema Alert and oriented, grossly normal motor and sensory function Skin Warm and Dry  Electrocardiogram demonstrates sinus rhythm at a Intervals 15/10/42 Axis LVIII  Assessment and  Plan

## 2012-08-18 NOTE — Patient Instructions (Signed)
Your physician recommends that you have lab work : bmp/magnesium- please take the order given to you today when you have your blood drawn.  Your physician wants you to follow-up in: 1 year with Dr. Graciela Husbands. You will receive a reminder letter in the mail two months in advance. If you don't receive a letter, please call our office to schedule the follow-up appointment.

## 2012-08-18 NOTE — Assessment & Plan Note (Signed)
Maintaining sinus rhythm. Continue flecainide. We will check her electrolytes. H she is also on apixaban.

## 2012-09-04 ENCOUNTER — Ambulatory Visit: Payer: BC Managed Care – PPO | Admitting: Internal Medicine

## 2012-09-18 ENCOUNTER — Other Ambulatory Visit: Payer: Self-pay | Admitting: Physician Assistant

## 2012-10-09 ENCOUNTER — Other Ambulatory Visit: Payer: Self-pay | Admitting: Family Medicine

## 2012-11-09 ENCOUNTER — Other Ambulatory Visit: Payer: Self-pay | Admitting: Internal Medicine

## 2012-12-14 ENCOUNTER — Encounter: Payer: Self-pay | Admitting: *Deleted

## 2012-12-14 ENCOUNTER — Other Ambulatory Visit: Payer: Self-pay | Admitting: Family Medicine

## 2012-12-14 ENCOUNTER — Other Ambulatory Visit: Payer: Self-pay | Admitting: Internal Medicine

## 2012-12-14 NOTE — Telephone Encounter (Signed)
Needs office visit.

## 2013-01-14 ENCOUNTER — Other Ambulatory Visit: Payer: Self-pay | Admitting: *Deleted

## 2013-01-14 MED ORDER — BENAZEPRIL-HYDROCHLOROTHIAZIDE 20-25 MG PO TABS: ORAL_TABLET | ORAL | Status: DC

## 2013-02-12 ENCOUNTER — Other Ambulatory Visit: Payer: Self-pay

## 2013-02-12 MED ORDER — BENAZEPRIL-HYDROCHLOROTHIAZIDE 20-25 MG PO TABS: ORAL_TABLET | ORAL | Status: DC

## 2013-02-12 MED ORDER — METFORMIN HCL 500 MG PO TABS: ORAL_TABLET | ORAL | Status: DC

## 2013-02-25 ENCOUNTER — Other Ambulatory Visit: Payer: Self-pay | Admitting: *Deleted

## 2013-02-25 MED ORDER — METFORMIN HCL 500 MG PO TABS: ORAL_TABLET | ORAL | Status: DC

## 2013-02-27 ENCOUNTER — Other Ambulatory Visit: Payer: Self-pay | Admitting: Internal Medicine

## 2013-03-01 ENCOUNTER — Telehealth: Payer: Self-pay | Admitting: Internal Medicine

## 2013-03-01 NOTE — Telephone Encounter (Signed)
Spoke with pt, no set date for dental work, concerned about Eliquis.  Will discuss with Graciela Husbands and get back with pt

## 2013-03-01 NOTE — Telephone Encounter (Signed)
New prob  Pt states she is having some dental work done and wants to speak a nurse regarding it. She also wants to discuss her medications.

## 2013-03-02 NOTE — Telephone Encounter (Signed)
Spoke with pt husband - asked him to give pt message that she does not need to stop her Eliquis for dental work per [employer], [address].

## 2013-03-04 ENCOUNTER — Ambulatory Visit (INDEPENDENT_AMBULATORY_CARE_PROVIDER_SITE_OTHER): Payer: BC Managed Care – PPO | Admitting: Family Medicine

## 2013-03-04 ENCOUNTER — Encounter: Payer: Self-pay | Admitting: Family Medicine

## 2013-03-04 VITALS — BP 132/88 | Ht 65.5 in | Wt 314.0 lb

## 2013-03-04 DIAGNOSIS — E785 Hyperlipidemia, unspecified: Secondary | ICD-10-CM

## 2013-03-04 DIAGNOSIS — I10 Essential (primary) hypertension: Secondary | ICD-10-CM

## 2013-03-04 DIAGNOSIS — R5381 Other malaise: Secondary | ICD-10-CM

## 2013-03-04 DIAGNOSIS — E119 Type 2 diabetes mellitus without complications: Secondary | ICD-10-CM

## 2013-03-04 DIAGNOSIS — Z79899 Other long term (current) drug therapy: Secondary | ICD-10-CM

## 2013-03-04 DIAGNOSIS — E782 Mixed hyperlipidemia: Secondary | ICD-10-CM

## 2013-03-04 LAB — BASIC METABOLIC PANEL
BUN: 11 mg/dL (ref 6–23)
Chloride: 99 mEq/L (ref 96–112)
Creat: 0.67 mg/dL (ref 0.50–1.10)

## 2013-03-04 LAB — CBC WITH DIFFERENTIAL/PLATELET
Basophils Absolute: 0 10*3/uL (ref 0.0–0.1)
Basophils Relative: 0 % (ref 0–1)
Eosinophils Absolute: 0.1 10*3/uL (ref 0.0–0.7)
Lymphs Abs: 1.5 10*3/uL (ref 0.7–4.0)
MCH: 30.8 pg (ref 26.0–34.0)
Neutrophils Relative %: 61 % (ref 43–77)
Platelets: 222 10*3/uL (ref 150–400)
RBC: 4.45 MIL/uL (ref 3.87–5.11)

## 2013-03-04 LAB — LIPID PANEL
Cholesterol: 216 mg/dL — ABNORMAL HIGH (ref 0–200)
LDL Cholesterol: 124 mg/dL — ABNORMAL HIGH (ref 0–99)
Triglycerides: 142 mg/dL (ref <150)

## 2013-03-04 LAB — HEPATIC FUNCTION PANEL
ALT: 49 U/L — ABNORMAL HIGH (ref 0–35)
Alkaline Phosphatase: 67 U/L (ref 39–117)
Indirect Bilirubin: 0.5 mg/dL (ref 0.0–0.9)
Total Protein: 6.9 g/dL (ref 6.0–8.3)

## 2013-03-04 LAB — POCT GLYCOSYLATED HEMOGLOBIN (HGB A1C): Hemoglobin A1C: 7

## 2013-03-04 MED ORDER — APIXABAN 5 MG PO TABS: ORAL_TABLET | ORAL | Status: DC

## 2013-03-04 MED ORDER — FLECAINIDE ACETATE 100 MG PO TABS: 100.0000 mg | ORAL_TABLET | Freq: Two times a day (BID) | ORAL | Status: DC

## 2013-03-04 MED ORDER — POTASSIUM CHLORIDE CRYS ER 20 MEQ PO TBCR: 20.0000 meq | EXTENDED_RELEASE_TABLET | Freq: Every day | ORAL | Status: DC

## 2013-03-04 MED ORDER — BENAZEPRIL-HYDROCHLOROTHIAZIDE 20-25 MG PO TABS: ORAL_TABLET | ORAL | Status: DC

## 2013-03-04 MED ORDER — ALBUTEROL SULFATE HFA 108 (90 BASE) MCG/ACT IN AERS: 1.0000 | INHALATION_SPRAY | Freq: Four times a day (QID) | RESPIRATORY_TRACT | Status: DC | PRN

## 2013-03-04 MED ORDER — METFORMIN HCL 500 MG PO TABS: 1000.0000 mg | ORAL_TABLET | Freq: Two times a day (BID) | ORAL | Status: DC

## 2013-03-04 MED ORDER — ALLOPURINOL 100 MG PO TABS: 100.0000 mg | ORAL_TABLET | Freq: Every day | ORAL | Status: DC

## 2013-03-04 NOTE — Progress Notes (Signed)
  Subjective:    Patient ID: Crystal Roth, female    DOB: July 22, 1950, 62 y.o.   MRN: [id number]  Diabetes She presents for her follow-up diabetic visit. She has type 2 diabetes mellitus. Her disease course has been fluctuating. There are no hypoglycemic associated symptoms. Pertinent negatives for hypoglycemia include no confusion. There are no diabetic associated symptoms. Pertinent negatives for diabetes include no blurred vision, no chest pain and no fatigue. There are no hypoglycemic complications. Symptoms are improving. There are no diabetic complications. There are no known risk factors for coronary artery disease. Current diabetic treatment includes oral agent (monotherapy). She is compliant with treatment all of the time. She is following a generally healthy diet. Meal planning includes avoidance of concentrated sweets. She participates in exercise intermittently. Her home blood glucose trend is increasing steadily. Her breakfast blood glucose is taken between 8-9 am. Her breakfast blood glucose range is generally 130-140 mg/dl. An ACE inhibitor/angiotensin II receptor blocker is being taken.  Hypertension This is a chronic problem. The current episode started more than 1 year ago. Pertinent negatives include no blurred vision or chest pain. Past treatments include ACE inhibitors and diuretics. The current treatment provides moderate improvement. Compliance problems include exercise.  There is no history of angina, kidney disease or CAD/MI.    Patient has no other concerns about her health today. Doing well.  Hx of a fib, no symtoms currently  Results for orders placed in visit on 03/04/13  POCT GLYCOSYLATED HEMOGLOBIN (HGB A1C)      Result Value Range   Hemoglobin A1C 7.0       Review of Systems  Constitutional: Negative for fatigue.  Eyes: Negative for blurred vision.  Cardiovascular: Negative for chest pain.  Psychiatric/Behavioral: Negative for confusion.       Objective:   Physical Exam  Alert significant obesity present. HEENT normal. Vitals reviewed. Lungs clear. Heart regular rate and rhythm. Blood pressure 1:30 radiates on repeat. Ankles no significant edema feet sensation intact pulses good.      Assessment & Plan:   impression 1 hypertension good control. #2 type 2 diabetes decent control. #3 hyperlipidemia status uncertain. #4 A. fib clinically stable. #5 obesity discussed. Plan diet exercise discussed in encourage. Appropriate blood work. WSL check in 6 months.

## 2013-03-05 LAB — MICROALBUMIN, URINE: Microalb, Ur: 48.45 mg/dL — ABNORMAL HIGH (ref 0.00–1.89)

## 2013-03-14 DIAGNOSIS — E785 Hyperlipidemia, unspecified: Secondary | ICD-10-CM | POA: Insufficient documentation

## 2013-03-14 DIAGNOSIS — E119 Type 2 diabetes mellitus without complications: Secondary | ICD-10-CM | POA: Insufficient documentation

## 2013-03-14 DIAGNOSIS — I10 Essential (primary) hypertension: Secondary | ICD-10-CM | POA: Insufficient documentation

## 2013-03-16 ENCOUNTER — Telehealth: Payer: Self-pay | Admitting: Family Medicine

## 2013-03-16 NOTE — Telephone Encounter (Signed)
See report in results section plz call pt

## 2013-03-16 NOTE — Telephone Encounter (Signed)
Pt would like results of blood work.

## 2013-03-16 NOTE — Telephone Encounter (Signed)
Patient would like someone to call regarding lab results from 03/04/2013

## 2013-03-16 NOTE — Telephone Encounter (Signed)
Patient notified of results.

## 2013-05-13 ENCOUNTER — Other Ambulatory Visit: Payer: Self-pay

## 2013-06-22 ENCOUNTER — Telehealth: Payer: Self-pay | Admitting: Cardiovascular Disease

## 2013-06-22 ENCOUNTER — Encounter: Payer: Self-pay | Admitting: *Deleted

## 2013-06-22 NOTE — Telephone Encounter (Signed)
Faxed letter to dentist office stating pt did not need to stop Eliquis.

## 2013-06-22 NOTE — Telephone Encounter (Signed)
Called pt to let her know that back in August, we addressed another tooth pull and stated then that she did not need to stop Eliquis for tooth extraction. Pt stated understanding but that dentist was uncomfortable with that. She will restart after procedure tomorrow. I will call dentist office and inform them she does not need to stop Eliquis. Pt agreeable to plan.

## 2013-06-22 NOTE — Telephone Encounter (Signed)
New message  Patient is currently on Eliquis, she has been off of it since last Tuesday. She was suppose to had a tooth extracted on Thursday and was unable to do so due to the tooth being infected. Dentist will be removing the tooth tomorrow 06/23/13 they need a fax saying how long she can be off the medication. Please fax to [phone].

## 2013-06-28 ENCOUNTER — Ambulatory Visit (INDEPENDENT_AMBULATORY_CARE_PROVIDER_SITE_OTHER): Payer: BC Managed Care – PPO | Admitting: Family Medicine

## 2013-06-28 ENCOUNTER — Encounter: Payer: Self-pay | Admitting: Family Medicine

## 2013-06-28 VITALS — BP 130/80 | Temp 98.5°F | Ht 65.0 in | Wt 316.2 lb

## 2013-06-28 DIAGNOSIS — R21 Rash and other nonspecific skin eruption: Secondary | ICD-10-CM

## 2013-06-28 DIAGNOSIS — B373 Candidiasis of vulva and vagina: Secondary | ICD-10-CM

## 2013-06-28 MED ORDER — AMOXICILLIN-POT CLAVULANATE 875-125 MG PO TABS: 1.0000 | ORAL_TABLET | Freq: Two times a day (BID) | ORAL | Status: AC

## 2013-06-28 MED ORDER — FLUCONAZOLE 150 MG PO TABS: ORAL_TABLET | ORAL | Status: DC

## 2013-06-28 NOTE — Progress Notes (Signed)
Subjective:    Patient ID: Crystal Roth, female    DOB: August 02, 1950, 62 y.o.   MRN: [id number]  Fever  This is a new problem. The current episode started in the past 7 days. The problem occurs intermittently. The problem has been gradually worsening. The maximum temperature noted was 100 to 100.9 F. Associated symptoms include muscle aches. She has tried acetaminophen for the symptoms. The treatment provided mild relief.  Has some cat scratches on abdomen and arm. Has yeast infection from previous antibiotic use.  Some nasal congestion Sat felt achey and fever, felt real tired  Sun slept til 3  Cat scratch and developed symptoms  Results for orders placed in visit on 03/04/13  LIPID PANEL      Result Value Range   Cholesterol 216 (*) 0 - 200 mg/dL   Triglycerides 409  <811 mg/dL   HDL 64  >91 mg/dL   Total CHOL/HDL Ratio 3.4     VLDL 28  0 - 40 mg/dL   LDL Cholesterol 478 (*) 0 - 99 mg/dL  HEPATIC FUNCTION PANEL      Result Value Range   Total Bilirubin 0.7  0.3 - 1.2 mg/dL   Bilirubin, Direct 0.2  0.0 - 0.3 mg/dL   Indirect Bilirubin 0.5  0.0 - 0.9 mg/dL   Alkaline Phosphatase 67  39 - 117 U/L   AST 48 (*) 0 - 37 U/L   ALT 49 (*) 0 - 35 U/L   Total Protein 6.9  6.0 - 8.3 g/dL   Albumin 4.0  3.5 - 5.2 g/dL  BASIC METABOLIC PANEL      Result Value Range   Sodium 138  135 - 145 mEq/L   Potassium 4.1  3.5 - 5.3 mEq/L   Chloride 99  96 - 112 mEq/L   CO2 29  19 - 32 mEq/L   Glucose, Bld 182 (*) 70 - 99 mg/dL   BUN 11  6 - 23 mg/dL   Creat 2.95  6.21 - 3.08 mg/dL   Calcium 9.3  8.4 - 65.7 mg/dL  CBC WITH DIFFERENTIAL      Result Value Range   WBC 5.2  4.0 - 10.5 K/uL   RBC 4.45  3.87 - 5.11 MIL/uL   Hemoglobin 13.7  12.0 - 15.0 g/dL   HCT 84.6  96.2 - 95.2 %   MCV 90.1  78.0 - 100.0 fL   MCH 30.8  26.0 - 34.0 pg   MCHC 34.2  30.0 - 36.0 g/dL   RDW 84.1  32.4 - 40.1 %   Platelets 222  150 - 400 K/uL   Neutrophils Relative % 61  43 - 77 %   Neutro Abs 3.1  1.7 - 7.7  K/uL   Lymphocytes Relative 29  12 - 46 %   Lymphs Abs 1.5  0.7 - 4.0 K/uL   Monocytes Relative 8  3 - 12 %   Monocytes Absolute 0.4  0.1 - 1.0 K/uL   Eosinophils Relative 2  0 - 5 %   Eosinophils Absolute 0.1  0.0 - 0.7 K/uL   Basophils Relative 0  0 - 1 %   Basophils Absolute 0.0  0.0 - 0.1 K/uL   Smear Review Criteria for review not met    MICROALBUMIN, URINE      Result Value Range   Microalb, Ur 48.45 (*) 0.00 - 1.89 mg/dL  MAGNESIUM      Result Value Range   Magnesium 1.3 (*)  1.5 - 2.5 mg/dL  URIC ACID      Result Value Range   Uric Acid, Serum 6.6  2.4 - 7.0 mg/dL  POCT GLYCOSYLATED HEMOGLOBIN (HGB A1C)      Result Value Range   Hemoglobin A1C 7.0       Review of Systems  Constitutional: Positive for fever.   No vomiting no diarrhea patient. Sure she has a yeast infection.    Objective:   Physical Exam  Alert mild malaise. H&T mom his congestion no fever currently. Lungs clear. Heart regular rate rhythm. Secondarily infected scratch on abdomen.      Assessment & Plan:  Impression 1 rhinosinusitis #2 viral syndrome #3 infected cat scratch #4 probable yeast infection. Plan Augmentin twice a day 10 days. Symptomatic care discussed. Diflucan when necessary. WSL

## 2013-06-29 ENCOUNTER — Encounter: Payer: Self-pay | Admitting: Family Medicine

## 2013-09-19 ENCOUNTER — Other Ambulatory Visit: Payer: Self-pay | Admitting: Family Medicine

## 2013-10-14 ENCOUNTER — Ambulatory Visit (INDEPENDENT_AMBULATORY_CARE_PROVIDER_SITE_OTHER): Payer: BC Managed Care – PPO | Admitting: Family Medicine

## 2013-10-14 ENCOUNTER — Encounter: Payer: Self-pay | Admitting: Family Medicine

## 2013-10-14 VITALS — BP 138/86 | Temp 99.0°F | Ht 65.0 in | Wt 305.0 lb

## 2013-10-14 DIAGNOSIS — L0390 Cellulitis, unspecified: Secondary | ICD-10-CM

## 2013-10-14 DIAGNOSIS — L0291 Cutaneous abscess, unspecified: Secondary | ICD-10-CM

## 2013-10-14 MED ORDER — DOXYCYCLINE HYCLATE 100 MG PO TABS: 100.0000 mg | ORAL_TABLET | Freq: Two times a day (BID) | ORAL | Status: DC

## 2013-10-14 NOTE — Progress Notes (Signed)
   Subjective:    Patient ID: Crystal Roth, female    DOB: 1950-12-12, 63 y.o.   MRN: [id number]  HPIAbscess on right side. Right breast feels swollen and painful.  Noticed it yesterday.   No obv fever yest some tod  Pain with it . Pain is considerable. No obvious fever or chills. But did feel a bit warm.  No history significant abscess.  Does admit that her sugars have been not in the best control lately.  Review of Systems No headache no chest pain no back pain no cough no abdominal pain ROS otherwise    Objective:   Physical Exam Alert no acute distress. Lungs clear. Heart rare rhythm. H&T normal. Vitals reviewed. Right lateral breast erythematous patch with central induration. No fluctuance tiny papule in the center       Assessment & Plan:  Impression cellulitis question etiology plan Doxy 100 twice a day 10 days. Symptomatic care discussed. WSL

## 2013-10-21 ENCOUNTER — Other Ambulatory Visit: Payer: Self-pay | Admitting: Family Medicine

## 2013-10-21 ENCOUNTER — Telehealth: Payer: Self-pay | Admitting: Family Medicine

## 2013-10-21 MED ORDER — DOXYCYCLINE HYCLATE 100 MG PO TABS: 100.0000 mg | ORAL_TABLET | Freq: Two times a day (BID) | ORAL | Status: DC

## 2013-10-21 NOTE — Telephone Encounter (Signed)
Patient says that her abcess is smaller and better. It is still pretty hard and she feels like she is going to need another round of antibiotics to heal the rest of this.   [facility] [place]

## 2013-10-21 NOTE — Telephone Encounter (Signed)
Rx sent electronically to pharmacy. Patient notified. 

## 2013-10-21 NOTE — Telephone Encounter (Signed)
Ok refill same abx

## 2013-11-22 ENCOUNTER — Encounter: Payer: Self-pay | Admitting: Family Medicine

## 2013-11-22 ENCOUNTER — Ambulatory Visit (INDEPENDENT_AMBULATORY_CARE_PROVIDER_SITE_OTHER): Payer: BC Managed Care – PPO | Admitting: Family Medicine

## 2013-11-22 VITALS — BP 128/78 | Ht 65.0 in | Wt 292.0 lb

## 2013-11-22 DIAGNOSIS — E119 Type 2 diabetes mellitus without complications: Secondary | ICD-10-CM

## 2013-11-22 DIAGNOSIS — E785 Hyperlipidemia, unspecified: Secondary | ICD-10-CM

## 2013-11-22 DIAGNOSIS — E782 Mixed hyperlipidemia: Secondary | ICD-10-CM

## 2013-11-22 DIAGNOSIS — R5381 Other malaise: Secondary | ICD-10-CM

## 2013-11-22 DIAGNOSIS — R5383 Other fatigue: Secondary | ICD-10-CM

## 2013-11-22 DIAGNOSIS — Z79899 Other long term (current) drug therapy: Secondary | ICD-10-CM

## 2013-11-22 DIAGNOSIS — I10 Essential (primary) hypertension: Secondary | ICD-10-CM

## 2013-11-22 LAB — POCT GLYCOSYLATED HEMOGLOBIN (HGB A1C): Hemoglobin A1C: 7

## 2013-11-22 MED ORDER — POTASSIUM CHLORIDE CRYS ER 20 MEQ PO TBCR: EXTENDED_RELEASE_TABLET | ORAL | Status: DC

## 2013-11-22 MED ORDER — ALLOPURINOL 100 MG PO TABS: 100.0000 mg | ORAL_TABLET | Freq: Every day | ORAL | Status: DC

## 2013-11-22 MED ORDER — BENAZEPRIL-HYDROCHLOROTHIAZIDE 20-25 MG PO TABS: ORAL_TABLET | ORAL | Status: DC

## 2013-11-22 MED ORDER — METFORMIN HCL 500 MG PO TABS: 1000.0000 mg | ORAL_TABLET | Freq: Two times a day (BID) | ORAL | Status: DC

## 2013-11-22 MED ORDER — FLECAINIDE ACETATE 100 MG PO TABS: 100.0000 mg | ORAL_TABLET | Freq: Two times a day (BID) | ORAL | Status: DC

## 2013-11-22 NOTE — Progress Notes (Signed)
   Subjective:    Patient ID: Crystal Roth, female    DOB: 1951-05-27, 63 y.o.   MRN: [id number]  Diabetes She presents for her follow-up diabetic visit. She has type 2 diabetes mellitus. Her disease course has been stable. There are no diabetic associated symptoms. Symptoms are stable. Current diabetic treatment includes oral agent (monotherapy). She is compliant with treatment all of the time. She rarely participates in exercise. She monitors blood glucose at home 1-2 x per day. Her breakfast blood glucose range is generally 130-140 mg/dl. She does not see a podiatrist.Eye exam is current.   Pt has a nodule on her back that she noticed a couple of months ago. Not painful.   Ended up with discomfort in neck and shoulder, cbc was good, no evid of infxn at the the [facility].  Yeast continued to be issue so given diflucan, neck better   Results for orders placed in visit on 11/22/13  POCT GLYCOSYLATED HEMOGLOBIN (HGB A1C)      Result Value Ref Range   Hemoglobin A1C 7.0     Still working full time/ overtime, has requested a transfer  Eye doc almost one yr, No diab retinopathy  BP numb generally good when cked elsewhere usu 140 over 8 0. Tolerating medications well. No obvious side effects. Has cut down salt intake.   Honey helping allergy, unprocessed honey is helping . Review of Systems No chest pain no back pain no abdominal pain no change in bowel habits no blood in stool ROS otherwise negative    Objective:   Physical Exam  Alert significant obesity present. HEENT mom his congestion neck supple. Lungs clear. Heart rare rhythm. Ankles without edema. Right lower lumbar region small freely movable soft palpable cystlike structure     Assessment & Plan:  Impression 1 hypertension good control. #2 type 2 diabetes decent control. Discussed. #3 allergic rhinitis. #4 lipoma versus sebaceous cyst 99.9 likelihood discussed plan diet discussed exercise discussed maintain same  medications. Recheck as scheduled. WSL

## 2013-11-26 ENCOUNTER — Encounter: Payer: Self-pay | Admitting: Family Medicine

## 2014-02-23 ENCOUNTER — Other Ambulatory Visit: Payer: Self-pay | Admitting: Family Medicine

## 2014-05-19 ENCOUNTER — Telehealth: Payer: Self-pay | Admitting: Family Medicine

## 2014-05-19 ENCOUNTER — Encounter: Payer: Self-pay | Admitting: Family Medicine

## 2014-05-19 MED ORDER — FLUCONAZOLE 150 MG PO TABS: 150.0000 mg | ORAL_TABLET | Freq: Once | ORAL | Status: DC

## 2014-05-19 NOTE — Telephone Encounter (Signed)
Patient has a yeast infection due to antibiotic use. She wants to know if we can call something in.  Please call when done.   [facility]

## 2014-05-19 NOTE — Telephone Encounter (Signed)
Patient notified

## 2014-05-23 ENCOUNTER — Other Ambulatory Visit: Payer: Self-pay | Admitting: Family Medicine

## 2014-05-27 ENCOUNTER — Encounter: Payer: Self-pay | Admitting: Family Medicine

## 2014-05-27 ENCOUNTER — Ambulatory Visit (INDEPENDENT_AMBULATORY_CARE_PROVIDER_SITE_OTHER): Payer: BC Managed Care – PPO | Admitting: Family Medicine

## 2014-05-27 VITALS — BP 134/90 | Temp 98.7°F | Ht 64.0 in | Wt 301.0 lb

## 2014-05-27 DIAGNOSIS — J209 Acute bronchitis, unspecified: Secondary | ICD-10-CM

## 2014-05-27 DIAGNOSIS — J4520 Mild intermittent asthma, uncomplicated: Secondary | ICD-10-CM

## 2014-05-27 MED ORDER — FLUCONAZOLE 150 MG PO TABS: 150.0000 mg | ORAL_TABLET | Freq: Once | ORAL | Status: DC

## 2014-05-27 MED ORDER — CEFDINIR 300 MG PO CAPS: 300.0000 mg | ORAL_CAPSULE | Freq: Two times a day (BID) | ORAL | Status: DC

## 2014-05-27 NOTE — Progress Notes (Signed)
   Subjective:    Patient ID: Crystal Roth, female    DOB: 31-Aug-1950, 63 y.o.   MRN: [id number]  Cough This is a new problem. The current episode started 1 to 4 weeks ago. The problem has been gradually worsening. The cough is productive of sputum. Associated symptoms include nasal congestion, postnasal drip, rhinorrhea, a sore throat and wheezing. The symptoms are aggravated by fumes. She has tried OTC cough suppressant (inhaler, antibiotics) for the symptoms. The treatment provided no relief. Her past medical history is significant for bronchitis.    Viral syp three wks ago  Progressed to sore throat  And right ear paind  And tonsil inflam  very painful  Took augmentin  Coughing bad   Using inhaler couple times per da  Review of Systems  HENT: Positive for postnasal drip, rhinorrhea and sore throat.   Respiratory: Positive for cough and wheezing.        Objective:   Physical Exam Alert moderate malaise. Vitals stable. Nasal congestion. Pharynx normal lungs bilateral wheezes no tachypnea bronchial cough heart regular in rhythm.       Assessment & Plan:  Impression rhinosinusitis/bronchitis with reactive airways plan Ventolin 2 sprays every 4 hours. Omnicef twice a day 10 days. Symptomatic care discussed. WSL

## 2014-06-05 ENCOUNTER — Other Ambulatory Visit: Payer: Self-pay | Admitting: Family Medicine

## 2014-06-06 ENCOUNTER — Telehealth: Payer: Self-pay | Admitting: Family Medicine

## 2014-06-06 MED ORDER — AMOXICILLIN-POT CLAVULANATE 875-125 MG PO TABS: 1.0000 | ORAL_TABLET | Freq: Two times a day (BID) | ORAL | Status: AC

## 2014-06-06 NOTE — Telephone Encounter (Signed)
Patient notified med sent to pharmacy.  

## 2014-06-06 NOTE — Telephone Encounter (Signed)
Aug 875 bid ten d 

## 2014-06-06 NOTE — Telephone Encounter (Signed)
Was prescribed Omnicef 300 mg BID x 10 days 

## 2014-06-06 NOTE — Telephone Encounter (Signed)
Patient seen on 05/27/14 for bronchitis.  She is still having a bad cough and congestion with sinus drainage.  Can we call something in?  [place] [facility]

## 2014-07-02 ENCOUNTER — Other Ambulatory Visit: Payer: Self-pay | Admitting: Family Medicine

## 2014-07-04 NOTE — Telephone Encounter (Signed)
Call pt sched appt, is she sched, may refill all meds times six mo, if doesn't sched one mo only (and well have to do again when comes in)

## 2014-07-04 NOTE — Telephone Encounter (Signed)
Last seen for check up 5/15

## 2014-07-15 ENCOUNTER — Other Ambulatory Visit: Payer: Self-pay | Admitting: Family Medicine

## 2014-08-16 ENCOUNTER — Other Ambulatory Visit: Payer: Self-pay | Admitting: Family Medicine

## 2014-08-26 ENCOUNTER — Telehealth: Payer: Self-pay | Admitting: Family Medicine

## 2014-08-26 ENCOUNTER — Other Ambulatory Visit: Payer: Self-pay | Admitting: *Deleted

## 2014-08-26 MED ORDER — POTASSIUM CHLORIDE CRYS ER 20 MEQ PO TBCR: EXTENDED_RELEASE_TABLET | ORAL | Status: DC

## 2014-08-26 MED ORDER — METFORMIN HCL 500 MG PO TABS: ORAL_TABLET | ORAL | Status: DC

## 2014-08-26 MED ORDER — BENAZEPRIL-HYDROCHLOROTHIAZIDE 20-25 MG PO TABS: 1.0000 | ORAL_TABLET | Freq: Every day | ORAL | Status: DC

## 2014-08-26 MED ORDER — ALLOPURINOL 100 MG PO TABS: 100.0000 mg | ORAL_TABLET | Freq: Every day | ORAL | Status: DC

## 2014-08-26 MED ORDER — APIXABAN 5 MG PO TABS: ORAL_TABLET | ORAL | Status: DC

## 2014-08-26 MED ORDER — FLECAINIDE ACETATE 100 MG PO TABS: 100.0000 mg | ORAL_TABLET | Freq: Two times a day (BID) | ORAL | Status: DC

## 2014-08-26 NOTE — Telephone Encounter (Signed)
error 

## 2014-09-06 ENCOUNTER — Ambulatory Visit (INDEPENDENT_AMBULATORY_CARE_PROVIDER_SITE_OTHER): Payer: BLUE CROSS/BLUE SHIELD | Admitting: Family Medicine

## 2014-09-06 ENCOUNTER — Encounter: Payer: Self-pay | Admitting: Family Medicine

## 2014-09-06 VITALS — BP 130/80 | Wt 301.0 lb

## 2014-09-06 DIAGNOSIS — E1121 Type 2 diabetes mellitus with diabetic nephropathy: Secondary | ICD-10-CM | POA: Diagnosis not present

## 2014-09-06 DIAGNOSIS — Z79899 Other long term (current) drug therapy: Secondary | ICD-10-CM | POA: Diagnosis not present

## 2014-09-06 DIAGNOSIS — I10 Essential (primary) hypertension: Secondary | ICD-10-CM

## 2014-09-06 DIAGNOSIS — E119 Type 2 diabetes mellitus without complications: Secondary | ICD-10-CM | POA: Diagnosis not present

## 2014-09-06 DIAGNOSIS — E785 Hyperlipidemia, unspecified: Secondary | ICD-10-CM | POA: Diagnosis not present

## 2014-09-06 LAB — POCT GLYCOSYLATED HEMOGLOBIN (HGB A1C): Hemoglobin A1C: 8.3

## 2014-09-06 MED ORDER — BENAZEPRIL-HYDROCHLOROTHIAZIDE 20-25 MG PO TABS: 1.0000 | ORAL_TABLET | Freq: Every day | ORAL | Status: DC

## 2014-09-06 MED ORDER — APIXABAN 5 MG PO TABS: ORAL_TABLET | ORAL | Status: DC

## 2014-09-06 MED ORDER — GLIPIZIDE 5 MG PO TABS: 5.0000 mg | ORAL_TABLET | Freq: Two times a day (BID) | ORAL | Status: DC

## 2014-09-06 MED ORDER — POTASSIUM CHLORIDE CRYS ER 20 MEQ PO TBCR: EXTENDED_RELEASE_TABLET | ORAL | Status: DC

## 2014-09-06 MED ORDER — ALLOPURINOL 100 MG PO TABS: 100.0000 mg | ORAL_TABLET | Freq: Every day | ORAL | Status: DC

## 2014-09-06 MED ORDER — FLECAINIDE ACETATE 100 MG PO TABS: 100.0000 mg | ORAL_TABLET | Freq: Two times a day (BID) | ORAL | Status: DC

## 2014-09-06 MED ORDER — METFORMIN HCL 500 MG PO TABS: ORAL_TABLET | ORAL | Status: DC

## 2014-09-06 NOTE — Progress Notes (Signed)
   Subjective:    Patient ID: Anthoney HaradaDana J Markham, female    DOB: 02/01/1951, 64 y.o.   MRN: [id number]  Diabetes She presents for her follow-up diabetic visit. She has type 2 diabetes mellitus. Risk factors for coronary artery disease include diabetes mellitus, hypertension, post-menopausal and obesity. Current diabetic treatment includes oral agent (monotherapy). She is compliant with treatment all of the time. She is following a diabetic diet. She has not had a previous visit with a dietitian. She does not see a podiatrist.Eye exam is not current.   BP numbers overall pretty good, numb 95ish oin the bottom. Sometimes in the 80s. Watching salt intake. Not exercising very much.  Eats late ane not the best  Results for orders placed or performed in visit on 11/22/13  POCT glycosylated hemoglobin (Hb A1C)  Result Value Ref Range   Hemoglobin A1C 7.0    Sister passed away suddenly, and dev grief from this. Feeling somewhat better mentally  occ cough from resp concerns,,dim energy   Wheezing at times, uees vent prn. Less than once per week  Plan to remove   Trying to watch fats in diet.  Has been on Tambocor and Samson Fredericlla quests because of history of atrial fibrillation. Patient has not seen her cardiologist for over one year.  Results for orders placed or performed in visit on 09/06/14  POCT glycosylated hemoglobin (Hb A1C)  Result Value Ref Range   Hemoglobin A1C 8.3       Review of Systems No headache no chest pain no back pain no abdominal pain no change in bowel habits no blood in stool    Objective:   Physical Exam  Alert substantial obesity present vital stable HET normal. Lungs clear. Heart regular rate and rhythm. C diabetic foot exam      Assessment & Plan:  Impression type 2 diabetes suboptimum control discussed #2 hypertension good control #3 hyperlipidemia status uncertain #4 history A. fib and chronic anticoagulation along with chronic fleck and night usage plan  encouraged to get back with cardiologist. Diet exercise discussed. Appropriate blood work and screening. Recheck in 4 months. WSL

## 2014-10-10 ENCOUNTER — Other Ambulatory Visit: Payer: Self-pay | Admitting: Family Medicine

## 2014-10-10 NOTE — Telephone Encounter (Signed)
Last seen 09/06/14

## 2014-10-10 NOTE — Telephone Encounter (Signed)
These two meds need to be rx'ed by her card

## 2014-10-11 ENCOUNTER — Ambulatory Visit (HOSPITAL_COMMUNITY)
Admission: RE | Admit: 2014-10-11 | Discharge: 2014-10-11 | Disposition: A | Discharge status: Home / Self Care | Payer: BLUE CROSS/BLUE SHIELD | Source: Ambulatory Visit | Attending: Family Medicine | Admitting: Family Medicine

## 2014-10-11 ENCOUNTER — Encounter: Payer: Self-pay | Admitting: Family Medicine

## 2014-10-11 ENCOUNTER — Ambulatory Visit (INDEPENDENT_AMBULATORY_CARE_PROVIDER_SITE_OTHER): Payer: BLUE CROSS/BLUE SHIELD | Admitting: Family Medicine

## 2014-10-11 VITALS — BP 130/76 | Temp 97.7°F | Ht 64.0 in | Wt 307.0 lb

## 2014-10-11 DIAGNOSIS — M79672 Pain in left foot: Secondary | ICD-10-CM | POA: Diagnosis not present

## 2014-10-11 MED ORDER — PREDNISONE 10 MG PO TABS: ORAL_TABLET | ORAL | Status: DC

## 2014-10-11 NOTE — Progress Notes (Signed)
   Subjective:    Patient ID: Anthoney HaradaDana J Hook, female    DOB: Oct 27, 1950, 64 y.o.   MRN: [id number]  Foot Injury  The incident occurred 3 to 5 days ago. The incident occurred at home. The injury mechanism is unknown. The pain is present in the left foot. The quality of the pain is described as aching. The pain is moderate. The pain has been intermittent since onset. She reports no foreign bodies present. The symptoms are aggravated by weight bearing. She has tried acetaminophen for the symptoms. The treatment provided no relief.   Patient states that she has no other concerns at this time.   Patient reports left lateral foot pain. Accompanied by swelling. Very painful when she puts her foot down. Wonders if she may have gout. Review of Systems No fever no chills no pain elsewhere.    Objective:   Physical Exam  Alert vital stable H&T normal. Lungs clear heart rare rhythm left lateral foot some mild swelling with tenderness in the fifth metatarsal tarsal joint area.      Assessment & Plan:  Impression foot pain and subacute question etiology plan prednisone trial for presume metatarsalgia. Left foot x-ray WSL

## 2014-10-17 ENCOUNTER — Telehealth: Payer: Self-pay | Admitting: Family Medicine

## 2014-10-17 NOTE — Telephone Encounter (Signed)
Pt called stating that she was bit by a tick yesterday and the site where the  Tick was is red. Pt wants to know if she needs to be seen.

## 2014-10-17 NOTE — Telephone Encounter (Signed)
Pt states she pulled deer tick off not sure how long it had been on her. It is a red ring around it. No symptoms. Pt requesting antibiotic to prevent any disease. Pt advised she would need office visit. Pt wanted to ask dr anyway if he could send in something because she cannot get off of work or if she should just watch for signs. [facility] [place].

## 2014-10-17 NOTE — Telephone Encounter (Signed)
Discussed with pt. Pt to call back if she needs appt.

## 2014-10-17 NOTE — Telephone Encounter (Signed)
Experts these days strongly discourage abx "just in case", red area around is generally a rxn to bite and  Not a sign of tick illness. If fever headache, rash elsewhere, expanding large red patch with central clearing occurs, rec o v

## 2014-10-31 ENCOUNTER — Encounter: Payer: Self-pay | Admitting: Physician Assistant

## 2014-10-31 ENCOUNTER — Ambulatory Visit (INDEPENDENT_AMBULATORY_CARE_PROVIDER_SITE_OTHER): Payer: BLUE CROSS/BLUE SHIELD | Admitting: Physician Assistant

## 2014-10-31 VITALS — BP 140/72 | HR 90 | Ht 65.0 in | Wt 311.0 lb

## 2014-10-31 DIAGNOSIS — I480 Paroxysmal atrial fibrillation: Secondary | ICD-10-CM

## 2014-10-31 DIAGNOSIS — E785 Hyperlipidemia, unspecified: Secondary | ICD-10-CM | POA: Diagnosis not present

## 2014-10-31 DIAGNOSIS — I10 Essential (primary) hypertension: Secondary | ICD-10-CM | POA: Diagnosis not present

## 2014-10-31 MED ORDER — APIXABAN 5 MG PO TABS: ORAL_TABLET | ORAL | Status: DC

## 2014-10-31 MED ORDER — FLECAINIDE ACETATE 100 MG PO TABS: 100.0000 mg | ORAL_TABLET | Freq: Two times a day (BID) | ORAL | Status: DC

## 2014-10-31 NOTE — Assessment & Plan Note (Signed)
Blood pressure controlled. 

## 2014-10-31 NOTE — Patient Instructions (Addendum)
Medication Instructions:  Your physician recommends that you continue on your current medications as directed. Please refer to the Current Medication list given to you today.  REFILLS ELIQUIS AND TAMBOCOR SENT TO  [facility] [place]   Labwork: NONE TODAY   Testing/Procedures: NONE TODAY   Follow-Up: Your physician wants you to follow-up in: ONE YEAR WITH DR Logan BoresKLEIN You will receive a reminder letter in the mail two months in advance. If you don't receive a letter, please call our office to schedule the follow-up appointment.   Any Other Special Instructions Will Be Listed Below (If Applicable).

## 2014-10-31 NOTE — Assessment & Plan Note (Signed)
Labs recently checked in [place]. Awaiting her to obtain a copy.

## 2014-10-31 NOTE — Assessment & Plan Note (Signed)
Most patient's problems stem from her obesity. She has gained over 50 pounds. She is struggling to exercise. She is working with a physician in [place] and is going to start an limitation diet program. Recommend increase exercise such as swimming or water aerobics.

## 2014-10-31 NOTE — Progress Notes (Signed)
Cardiology Office Note   Date:  10/31/2014   ID:  Yorley, Buch 07-29-1950, MRN [id number]  PCP:  Lubertha South, MD  Cardiologist:  Graciela Husbands   Chief Complaint:     History of Present Illness: Crystal Roth is a 64 y.o. female who presents for routine follow-up. He last saw Dr. Graciela Husbands in 08/2012 and is followed for atrial fibrillation which converted to sinus rhythm on Tambocor and Eliquis. She has not had any problems with chest pain, palpitations, dizziness or presyncope. She has chronic dyspnea on exertion related to deconditioning and weight gain. She's gained approximately 50 pounds over the past year or so. 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 She's having a lot of joint problems because of her weight gain. She also has had kidney stones and is due to have her colonoscopy this Thursday. She is talking to a holistic physician in [place] about a weight loss program and elimination diet. Low but A1c was over 8.    Past Medical History  Diagnosis Date  . Diabetes mellitus   . Hypertension   . Reactive airway disease   . PONV (postoperative nausea and vomiting)   . Atrial fibrillation     dx 5/13; Apixaban; echo 5/13: mild LVH, EF 60-65%, mod LAe, mild RAE, PASP 29  . kidney calculus   . Morbid obesity   . Fatty liver     Past Surgical History  Procedure Laterality Date  . Abdominal hysterectomy    . Foot surgery    . Dilation and curettage of uterus    . Tubal ligation    . Cardioversion  12/27/2011    Procedure: CARDIOVERSION;  Surgeon: Duke Salvia, MD;  [facility];  Service: Cardiovascular;  Laterality: N/A;  . Cardioversion  01/17/2012    Procedure: CARDIOVERSION;  Surgeon: Duke Salvia, MD;  Location: [facility] OR;  Service: Cardiovascular;  Laterality: N/A;     Current Outpatient Prescriptions  Medication Sig Dispense Refill  . acetaminophen (TYLENOL) 500 MG tablet Take 500 mg by mouth every 6 (six) hours as needed. Pain     . allopurinol (ZYLOPRIM) 100 MG  tablet Take 1 tablet (100 mg total) by mouth daily. 30 tablet 5  . apixaban (ELIQUIS) 5 MG TABS tablet TAKE ONE TABLET BY MOUTH TWICE DAILY 60 tablet 6  . benazepril-hydrochlorthiazide (LOTENSIN HCT) 20-25 MG per tablet Take 1 tablet by mouth daily. 30 tablet 5  . flecainide (TAMBOCOR) 100 MG tablet Take 1 tablet (100 mg total) by mouth 2 (two) times daily. 60 tablet 6  . glipiZIDE (GLUCOTROL) 5 MG tablet Take 1 tablet (5 mg total) by mouth 2 (two) times daily before a meal. 60 tablet 5  . MAGNESIUM PO Take by mouth.    . metFORMIN (GLUCOPHAGE) 500 MG tablet TAKE TWO TABLETS BY MOUTH TWICE DAILY WITH MEALS 120 tablet 5  . potassium chloride SA (KLOR-CON M20) 20 MEQ tablet TAKE ONE TABLET BY MOUTH ONCE DAILY 30 tablet 5  . predniSONE (DELTASONE) 10 MG tablet Take 4 tabs qd x 3 days, 3 tabs qd x 2 days, 2 tabs qd x 2 days 22 tablet 0  . PROVENTIL HFA 108 (90 BASE) MCG/ACT inhaler INHALE ONE TO TWO PUFFS INTO LUNGS EVERY 6 HOURS AS NEEDED FOR WHEEZING 7 each 2   No current facility-administered medications for this visit.    Allergies:   Nsaids    Social History:  The patient  reports that she has never smoked.  She has never used smokeless tobacco. She reports that she does not drink alcohol or use illicit drugs.   Family History:  The patient's family history includes Osteoarthritis in her father; Parkinsonism in her mother.    ROS:  Please see the history of present illness.   Otherwise, review of systems are positive for patient no skipped heartbeat, hemorrhoids, chronic back pain and muscle pain and joint swelling..   All other systems are reviewed and negative.    PHYSICAL EXAM: VS:  BP 140/72 mmHg  Pulse 90  Ht 5\' 5"  (1.651 m)  Wt 311 lb (141.069 kg)  BMI 51.75 kg/m2 , BMI Body mass index is 51.75 kg/(m^2). GEN: Well nourished, well developed, in no acute distress Neck: no JVD, HJR, carotid bruits, or masses Cardiac:RRR; 1 to 2/6 systolic murmur at the right sternal border, no  gallop, rubs, thrill or heave,  Respiratory:  clear to auscultation bilaterally, normal work of breathing GI: soft, nontender, nondistended, + BS MS: no deformity or atrophy Extremities: without cyanosis, clubbing, edema, good distal pulses bilaterally.  Skin: warm and dry, no rash Neuro:  Strength and sensation are intact    EKG:  EKG is ordered today. The ekg ordered today demonstrates normal sinus rhythm QTC is 479   Recent Labs: No results found for requested labs within last 365 days.    Lipid Panel    Component Value Date/Time   CHOL 216* 03/04/2013 0935   TRIG 142 03/04/2013 0935   HDL 64 03/04/2013 0935   CHOLHDL 3.4 03/04/2013 0935   VLDL 28 03/04/2013 0935   LDLCALC 124* 03/04/2013 0935      Wt Readings from Last 3 Encounters:  10/31/14 311 lb (141.069 kg)  10/11/14 307 lb (139.254 kg)  09/06/14 301 lb (136.533 kg)      Other studies Reviewed: Additional studies/ records that were reviewed today include and review of the records demonstrates: Stress nuclear study 2013 Impression Exercise Capacity:  Poor exercise capacity. BP Response:  Normal blood pressure response. Clinical Symptoms:  There is dyspnea. ECG Impression:  1 mm ST segment depression in inferolateral leads Comparison with Prior Nuclear Study: No previous nuclear study performed  Overall Impression:  Low risk stress nuclear study.  Nuclear images are normal.  Positive ECG response  LV Ejection Fraction: 74%.  LV Wall Motion:  NL LV Function; NL Wall Motion   [facility] Hawseter Nishan   2-D echo 2013 Study Conclusions  - Left ventricle: The cavity size was normal. Wall thickness   was increased in a pattern of mild LVH. Systolic function   was normal. The estimated ejection fraction was in the   range of 60% to 65%. Wall motion was normal; there were no   regional wall motion abnormalities. Indeterminant   diastolic function. - Aortic valve: There was no stenosis. - Mitral valve: No  significant regurgitation. - Left atrium: The atrium was moderately dilated. - Right ventricle: The cavity size was normal. Systolic   function was normal. - Right atrium: The atrium was mildly dilated. - Tricuspid valve: Peak RV-RA gradient: 24mm Hg (S). - Pulmonary arteries: PA peak pressure: 29mm Hg (S). - Inferior vena cava: The vessel was normal in size; the   respirophasic diameter changes were in the normal range (=   50%); findings are consistent with normal central venous   pressure. Impressions:  - The patient was in atrial fibrillation. Normal LV size   with mild LV hypertrophy. EF 60-65%. Normal RV size and  systolic function. Biatrial enlargement. No significant   valvular abnormalities.  ASSESSMENT AND PLAN:  Essential hypertension, benign Blood pressure controlled.   Atrial fibrillation Patient is maintaining normal sinus rhythm and flecainide and Eliquis. She had blood work recently in [place] and is going to get [place] a copy of it. Follow-up with Dr. Graciela Husbands in one year.   Morbid obesity Most patient's problems stem from her obesity. She has gained over 50 pounds. She is struggling to exercise. She is working with a physician in [place] and is going to start an limitation diet program. Recommend increase exercise such as swimming or water aerobics.   Hyperlipidemia LDL goal <100 Labs recently checked in [place]. Awaiting her to obtain a copy.     Signed, Jacolyn Reedy, PA-C  10/31/2014 2:45 PM    [facility] [address] [place], [place], [place]  [address] Phone: [phone]; Fax: [phone]

## 2014-10-31 NOTE — Assessment & Plan Note (Signed)
Patient is maintaining normal sinus rhythm and flecainide and Eliquis. She had blood work recently in [place] and is going to get us a copy of it. Follow-up with Dr. Graciela HusbandsKlein in one year.

## 2014-12-13 ENCOUNTER — Encounter: Payer: Self-pay | Admitting: Family Medicine

## 2015-02-21 ENCOUNTER — Other Ambulatory Visit: Payer: Self-pay | Admitting: Family Medicine

## 2015-02-23 ENCOUNTER — Ambulatory Visit (INDEPENDENT_AMBULATORY_CARE_PROVIDER_SITE_OTHER): Payer: No Typology Code available for payment source | Admitting: Family Medicine

## 2015-02-23 ENCOUNTER — Encounter: Payer: Self-pay | Admitting: Family Medicine

## 2015-02-23 VITALS — BP 112/64 | Ht 64.0 in | Wt 268.1 lb

## 2015-02-23 DIAGNOSIS — E782 Mixed hyperlipidemia: Secondary | ICD-10-CM | POA: Diagnosis not present

## 2015-02-23 DIAGNOSIS — E119 Type 2 diabetes mellitus without complications: Secondary | ICD-10-CM | POA: Diagnosis not present

## 2015-02-23 DIAGNOSIS — I10 Essential (primary) hypertension: Secondary | ICD-10-CM | POA: Diagnosis not present

## 2015-02-23 NOTE — Progress Notes (Signed)
   Subjective:    Patient ID: Crystal Roth, female    DOB: June 04, 1951, 64 y.o.   MRN: [id number] Patient arrives office for follow-up of her multiple concerns.  Has started to see a new "holistic" Dr. This Dr. has adjusted her medications for her diabetes and her hypertension. She is also place the patient on supplement switch her inflamed middle ear to me. Patient was given also a months treatment of Diflucan daily because of "excess yeast in the body"   Diabetes She presents for her follow-up diabetic visit. She has type 2 diabetes mellitus. No MedicAlert identification noted. She has not had a previous visit with a dietitian. She does not see a podiatrist.Eye exam is not current.   Patient has recent A1c results with her. Patient states no other concerns this visit.  Swimming and exercising more, trying to cut down calories  Glipizide felt bad, and had a couple low sugar spells. So stopped   Pt taking supplements, cortisol tends to run high  Pt was given diflucan daily for a month   Also given detox liver meds by the "holisitic" doc.  Also now on lasix daily , stopped the lot hctz combo  Continues to see cardiologist for atrial fibrillation Review of Systems No headache no chest pain no back pain some shortness breath with exertion mild joint pain    Objective:   Physical Exam Alert vitals stable blood pressure good on repeat morbid obesity present H&T normal. Lungs clear. Heart regular rhythm. Ankles without edema. C diabetic foot exam       Assessment & Plan:  Impression 1 type 2 diabetes control improved #2 hypertension clinically stable #3 morbid obesity ongoing challenge #4 atrial fibrillation discussed #5 multiple new supplements some which are frankly nonstandard but administered by the "holistic doctor" plan patient states she would like to continue her diabetic and hypertensive in general health management with her holistic doctor at this time. We will certainly  honor her request.. We will sign off on management of these conditions for her. Patient to follow-up as needed WSL

## 2015-03-26 ENCOUNTER — Other Ambulatory Visit: Payer: Self-pay | Admitting: Family Medicine

## 2015-05-23 ENCOUNTER — Other Ambulatory Visit: Payer: Self-pay | Admitting: Physician Assistant

## 2015-09-20 ENCOUNTER — Other Ambulatory Visit: Payer: Self-pay | Admitting: Family Medicine

## 2015-10-21 ENCOUNTER — Other Ambulatory Visit: Payer: Self-pay | Admitting: Physician Assistant

## 2015-10-21 ENCOUNTER — Other Ambulatory Visit: Payer: Self-pay | Admitting: Family Medicine

## 2015-11-26 ENCOUNTER — Other Ambulatory Visit: Payer: Self-pay | Admitting: Physician Assistant

## 2015-11-26 ENCOUNTER — Other Ambulatory Visit: Payer: Self-pay | Admitting: Family Medicine

## 2015-11-27 ENCOUNTER — Other Ambulatory Visit: Payer: Self-pay | Admitting: Physician Assistant

## 2015-11-27 MED ORDER — FLECAINIDE ACETATE 100 MG PO TABS: 100.0000 mg | ORAL_TABLET | Freq: Two times a day (BID) | ORAL | Status: DC

## 2015-12-24 ENCOUNTER — Other Ambulatory Visit: Payer: Self-pay | Admitting: Physician Assistant

## 2015-12-24 ENCOUNTER — Other Ambulatory Visit: Payer: Self-pay | Admitting: Family Medicine

## 2016-01-27 ENCOUNTER — Other Ambulatory Visit: Payer: Self-pay | Admitting: Family Medicine

## 2016-01-27 ENCOUNTER — Other Ambulatory Visit: Payer: Self-pay | Admitting: Physician Assistant

## 2016-02-26 ENCOUNTER — Other Ambulatory Visit: Payer: Self-pay | Admitting: Physician Assistant

## 2016-03-12 ENCOUNTER — Encounter: Payer: Self-pay | Admitting: Internal Medicine

## 2016-03-14 ENCOUNTER — Ambulatory Visit (INDEPENDENT_AMBULATORY_CARE_PROVIDER_SITE_OTHER): Payer: No Typology Code available for payment source | Admitting: Internal Medicine

## 2016-03-14 ENCOUNTER — Encounter: Payer: Self-pay | Admitting: Internal Medicine

## 2016-03-14 VITALS — BP 116/78 | HR 96 | Ht 66.0 in | Wt 251.2 lb

## 2016-03-14 DIAGNOSIS — Z01812 Encounter for preprocedural laboratory examination: Secondary | ICD-10-CM

## 2016-03-14 DIAGNOSIS — I480 Paroxysmal atrial fibrillation: Secondary | ICD-10-CM

## 2016-03-14 LAB — CBC WITH DIFFERENTIAL/PLATELET
BASOS ABS: 0 {cells}/uL (ref 0–200)
BASOS PCT: 0 %
EOS ABS: 120 {cells}/uL (ref 15–500)
Eosinophils Relative: 2 %
HEMATOCRIT: 44.4 % (ref 35.0–45.0)
HEMOGLOBIN: 15.5 g/dL (ref 11.7–15.5)
Lymphocytes Relative: 34 %
Lymphs Abs: 2040 cells/uL (ref 850–3900)
MCH: 31.3 pg (ref 27.0–33.0)
MCHC: 34.9 g/dL (ref 32.0–36.0)
MCV: 89.7 fL (ref 80.0–100.0)
MONO ABS: 840 {cells}/uL (ref 200–950)
MONOS PCT: 14 %
MPV: 10.4 fL (ref 7.5–12.5)
NEUTROS ABS: 3000 {cells}/uL (ref 1500–7800)
Neutrophils Relative %: 50 %
PLATELETS: 214 10*3/uL (ref 140–400)
RBC: 4.95 MIL/uL (ref 3.80–5.10)
RDW: 14.6 % (ref 11.0–15.0)
WBC: 6 10*3/uL (ref 3.8–10.8)

## 2016-03-14 NOTE — Progress Notes (Signed)
Patient Care Team: Merlyn Albert, MD as PCP - General (Family Medicine)   HPI  Crystal Roth is a 65 y.o. female Seen in follow-up for paroxysmal atrial fibrillation for which she has taken apixoban and flecainide.  She's not been aware of any changes in her heart rhythm  She has been successful in losing 70 pounds over the last year. She is exercising 4-5 days a week. A sleep study a few years ago was negative.  Diabetes is under control   CHADS-VASc score is 3 (Gender, hypertension, diabetes.)  Records and Results Reviewed remote ECGs  Past Medical History:  Diagnosis Date  . Atrial fibrillation (HCC)    dx 5/13; Apixaban; echo 5/13: mild LVH, EF 60-65%, mod LAe, mild RAE, PASP 29  . Diabetes mellitus   . Fatty liver   . Hypertension   . kidney calculus   . Morbid obesity (HCC)   . PONV (postoperative nausea and vomiting)   . Reactive airway disease     Past Surgical History:  Procedure Laterality Date  . ABDOMINAL HYSTERECTOMY    . CARDIOVERSION  12/27/2011   Procedure: CARDIOVERSION;  Surgeon: Duke Salvia, MD;  Location: [facility] OR;  Service: Cardiovascular;  Laterality: N/A;  . CARDIOVERSION  01/17/2012   Procedure: CARDIOVERSION;  Surgeon: Duke Salvia, MD;  Location[facility] OR;  Service: Cardiovascular;  Laterality: N/A;  . DILATION AND CURETTAGE OF UTERUS    . FOOT SURGERY    . TUBAL LIGATION      Current Outpatient Prescriptions  Medication Sig Dispense Refill  . allopurinol (ZYLOPRIM) 300 MG tablet Take 300 mg by mouth daily.    Marland Kitchen acetaminophen (TYLENOL) 500 MG tablet Take 500 mg by mouth every 6 (six) hours as needed. Pain     . benazepril (LOTENSIN) 20 MG tablet Take 20 mg by mouth daily.    Marland Kitchen ELIQUIS 5 MG TABS tablet TAKE ONE TABLET BY MOUTH TWICE DAILY (  PLEASE  CALL  AND  MAKE  A  ONE  YEAR  FOLLOW  UP  APPOINTMENT) 30 tablet 0  . flecainide (TAMBOCOR) 100 MG tablet TAKE ONE TABLET BY MOUTH TWICE DAILY (NEED  FOLLOW  UP) 60 tablet 1  .  furosemide (LASIX) 20 MG tablet Take 20 mg by mouth daily.    Marland Kitchen KLOR-CON M20 20 MEQ tablet TAKE ONE TABLET BY MOUTH ONCE DAILY **NEED OFFICE VISIT** 15 tablet 0  . MAGNESIUM PO Take 400 mg by mouth 2 (two) times daily.     Marland Kitchen PROVENTIL HFA 108 (90 BASE) MCG/ACT inhaler INHALE ONE TO TWO PUFFS INTO LUNGS EVERY 6 HOURS AS NEEDED FOR WHEEZING 7 each 2  . UNABLE TO FIND Take 2 tablets by mouth 2 (two) times daily at 10 AM and 5 PM. Mitochondrial NRG     . UNKNOWN TO PATIENT Take 1 Dose by mouth daily.      No current facility-administered medications for this visit.     Allergies  Allergen Reactions  . Nsaids Shortness Of Breath      Review of Systems negative except from HPI and PMH  Physical Exam BP 116/78   Pulse 96   Ht 5\' 6"  (1.676 m)   Wt 251 lb 3.2 oz (113.9 kg)   SpO2 95%   BMI 40.54 kg/m  Well developed and well nourished in no acute distress HENT normal E scleral and icterus clear Neck Supple JVP flat; carotids brisk and full Clear to  ausculation  Regular rate and rhythm, no murmurs gallops or rub Soft with active bowel sounds No clubbing cyanosis  Edema Alert and oriented, grossly normal motor and sensory function Skin Warm and Dry  ECG demonstrates atrial flutter with 2 to one conduction with a ventricular rate of 96 Intervals-/10/39   Assessment and  Plan Atrial flutter/fibrillation  Flecainide therapy causing fibrillation--flutter  Hypertension    The patient is doing relatively well. She is continuing to work on weight loss and exercise.  She is in atrial flutter today. She is unaware of its duration; she has been taking her ELIQUIS daily and so we will undertake cardioversion. I suggested that she use her pulse oximeter to try to discern recurrence of her atrial arrhythmia changes pulse from her sinus rates which are in the 50-60 range to her flutter rates which are in the 90-100 range.  We will check electrolytes    Current medicines are reviewed  at length with the patient today .  The patient does not have concerns regarding medicines.

## 2016-03-14 NOTE — Patient Instructions (Addendum)
Medication Instructions:  Your physician recommends that you continue on your current medications as directed. Please refer to the Current Medication list given to you today.  Labwork: Pre procedure labs today: BMET & CBC w/ diff  Testing/Procedures: Your physician has recommended that you have a Cardioversion (DCCV). Electrical Cardioversion uses a jolt of electricity to your heart either through paddles or wired patches attached to your chest. This is a controlled, usually prescheduled, procedure. Defibrillation is done under light anesthesia in the hospital, and you usually go home the day of the procedure. This is done to get your heart back into a normal rhythm. You are not awake for the procedure. Please see the instruction below:   Your provider has recommended a cardioversion.   You are scheduled for a cardioversion on 03/15/16 at 1:00 pm with Dr. Elease HashimotoNahser or associates. Please [facility] Do not have any food or drink after 7:00 a.m. tomorrow morning. You may take your medicines with a sip of water on the day of your procedure. Hold your Lasix that morning. You will need someone to drive you home following your procedure.   Call the [facility] office at [phone] if you have any questions, problems or concerns.    Follow-Up: Your physician wants you to follow-up in: 1 year with Dr. Graciela HusbandsKlein.  You will receive a reminder letter in the mail two months in advance. If you don't receive a letter, please call our office to schedule the follow-up appointment.   If you need a refill on your cardiac medications before your next appointment, please call your pharmacy.  Thank you for choosing [facility]!!

## 2016-03-15 ENCOUNTER — Ambulatory Visit (HOSPITAL_COMMUNITY): Payer: No Typology Code available for payment source | Admitting: Anesthesiology

## 2016-03-15 ENCOUNTER — Telehealth: Payer: Self-pay

## 2016-03-15 ENCOUNTER — Encounter (HOSPITAL_COMMUNITY): Payer: Self-pay | Admitting: Anesthesiology

## 2016-03-15 ENCOUNTER — Encounter (HOSPITAL_COMMUNITY)
Admission: RE | Disposition: A | Discharge status: Home / Self Care | Payer: Self-pay | Source: Ambulatory Visit | Attending: Cardiovascular Disease

## 2016-03-15 ENCOUNTER — Ambulatory Visit (HOSPITAL_COMMUNITY)
Admission: RE | Admit: 2016-03-15 | Discharge: 2016-03-15 | Disposition: A | Discharge status: Home / Self Care | Payer: No Typology Code available for payment source | Source: Ambulatory Visit | Attending: Cardiovascular Disease | Admitting: Cardiovascular Disease

## 2016-03-15 DIAGNOSIS — E119 Type 2 diabetes mellitus without complications: Secondary | ICD-10-CM | POA: Diagnosis not present

## 2016-03-15 DIAGNOSIS — J45909 Unspecified asthma, uncomplicated: Secondary | ICD-10-CM | POA: Insufficient documentation

## 2016-03-15 DIAGNOSIS — I4892 Unspecified atrial flutter: Secondary | ICD-10-CM | POA: Diagnosis not present

## 2016-03-15 DIAGNOSIS — I480 Paroxysmal atrial fibrillation: Secondary | ICD-10-CM | POA: Insufficient documentation

## 2016-03-15 DIAGNOSIS — Z6841 Body Mass Index (BMI) 40.0 and over, adult: Secondary | ICD-10-CM | POA: Diagnosis not present

## 2016-03-15 DIAGNOSIS — Z79899 Other long term (current) drug therapy: Secondary | ICD-10-CM | POA: Insufficient documentation

## 2016-03-15 DIAGNOSIS — I4891 Unspecified atrial fibrillation: Secondary | ICD-10-CM | POA: Diagnosis not present

## 2016-03-15 DIAGNOSIS — I10 Essential (primary) hypertension: Secondary | ICD-10-CM | POA: Diagnosis not present

## 2016-03-15 DIAGNOSIS — Z7901 Long term (current) use of anticoagulants: Secondary | ICD-10-CM | POA: Insufficient documentation

## 2016-03-15 HISTORY — PX: CARDIOVERSION: SHX1299

## 2016-03-15 LAB — BASIC METABOLIC PANEL
BUN: 23 mg/dL (ref 7–25)
CHLORIDE: 99 mmol/L (ref 98–110)
CO2: 26 mmol/L (ref 20–31)
CREATININE: 0.81 mg/dL (ref 0.50–0.99)
Calcium: 9.9 mg/dL (ref 8.6–10.4)
Glucose, Bld: 98 mg/dL (ref 65–99)
POTASSIUM: 4 mmol/L (ref 3.5–5.3)
Sodium: 141 mmol/L (ref 135–146)

## 2016-03-15 SURGERY — CARDIOVERSION
Anesthesia: Monitor Anesthesia Care

## 2016-03-15 MED ORDER — PROPOFOL 10 MG/ML IV BOLUS
INTRAVENOUS | Status: DC | PRN
  Administered 2016-03-15: 70 mg via INTRAVENOUS

## 2016-03-15 MED ORDER — LIDOCAINE 2% (20 MG/ML) 5 ML SYRINGE
INTRAMUSCULAR | Status: DC | PRN
  Administered 2016-03-15: 100 mg via INTRAVENOUS

## 2016-03-15 MED ORDER — SODIUM CHLORIDE 0.9 % IV SOLN
INTRAVENOUS | Status: DC
  Administered 2016-03-15: 500 mL via INTRAVENOUS

## 2016-03-15 NOTE — Anesthesia Preprocedure Evaluation (Addendum)
Anesthesia Evaluation  Patient identified by MRN, date of birth, ID band  Reviewed: Allergy & Precautions, NPO status , Patient's Chart, lab work & pertinent test results  Airway Mallampati: II  TM Distance: <3 FB Neck ROM: Full    Dental no notable dental hx. (+) Teeth Intact, Dental Advisory Given   Pulmonary    Pulmonary exam normal        Cardiovascular hypertension, Normal cardiovascular exam+ dysrhythmias Atrial Fibrillation      Neuro/Psych    GI/Hepatic   Endo/Other  diabetes, Type 2  Renal/GU      Musculoskeletal   Abdominal   Peds  Hematology   Anesthesia Other Findings   Reproductive/Obstetrics                            Anesthesia Physical Anesthesia Plan  ASA: III  Anesthesia Plan:    Post-op Pain Management:    Induction:   Airway Management Planned:   Additional Equipment:   Intra-op Plan:   Post-operative Plan:   Informed Consent:   Plan Discussed with:   Anesthesia Plan Comments:         Anesthesia Quick Evaluation

## 2016-03-15 NOTE — Telephone Encounter (Signed)
Pt is aware of results. She says "YAAY"! Pt and Pt's husband are aware and agreeable.

## 2016-03-15 NOTE — CV Procedure (Signed)
    Cardioversion Note  Crystal Roth [id number] 15-Nov-1950  Procedure: DC Cardioversion Indications: atrial fib   Procedure Details Consent: Obtained Time Out: Verified patient identification, verified procedure, site/side was marked, verified correct patient position, special equipment/implants available, Radiology Safety Procedures followed,  medications/allergies/relevent history reviewed, required imaging and test results available.  Performed  The patient has been on adequate anticoagulation.  The patient received IV Lidocaine 70 mg followed by propofol 60 mg  for sedation.  Synchronous cardioversion was performed at 120  joules.  The cardioversion was successful     Complications: No apparent complications Patient did tolerate procedure well.   Vesta MixerPhilip J. Nahser, Montez HagemanJr., MD, [facility] 03/15/2016, 2:35 PM

## 2016-03-15 NOTE — Interval H&P Note (Signed)
History and Physical Interval Note:  03/15/2016 1:47 PM  Anthoney HaradaDana J Galvan  has presented today for surgery, with the diagnosis of AFIB  The various methods of treatment have been discussed with the patient and family. After consideration of risks, benefits and other options for treatment, the patient has consented to  Procedure(s): CARDIOVERSION (N/A) as a surgical intervention .  The patient's history has been reviewed, patient examined, no change in status, stable for surgery.  I have reviewed the patient's chart and labs.  Questions were answered to the patient's satisfaction.     Kristeen MissPhilip Nahser

## 2016-03-15 NOTE — Transfer of Care (Signed)
Immediate Anesthesia Transfer of Care Note  Patient: Crystal HaradaDana J Netto  Procedure(s) Performed: Procedure(s): CARDIOVERSION (N/A)  Patient Location: Endoscopy Unit  Anesthesia Type:MAC  Level of Consciousness: awake  Airway & Oxygen Therapy: Patient Spontanous Breathing  Post-op Assessment: Report given to RN and Post -op Vital signs reviewed and stable  Post vital signs: Reviewed and stable  Last Vitals:  Vitals:   03/15/16 1329  BP: (!) 104/46  Pulse: 92  Resp: 19  Temp: 36.9 C    Last Pain:  Vitals:   03/15/16 1329  TempSrc: Oral         Complications: No apparent anesthesia complications

## 2016-03-15 NOTE — H&P (View-Only) (Signed)
    Patient Care Team: William S Luking, MD as PCP - General (Family Medicine)   HPI  Crystal Roth is a 65 y.o. female Seen in follow-up for paroxysmal atrial fibrillation for which she has taken apixoban and flecainide.  She's not been aware of any changes in her heart rhythm  She has been successful in losing 70 pounds over the last year. She is exercising 4-5 days a week. A sleep study a few years ago was negative.  Diabetes is under control   CHADS-VASc score is 3 (Gender, hypertension, diabetes.)  Records and Results Reviewed remote ECGs  Past Medical History:  Diagnosis Date  . Atrial fibrillation (HCC)    dx 5/13; Apixaban; echo 5/13: mild LVH, EF 60-65%, mod LAe, mild RAE, PASP 29  . Diabetes mellitus   . Fatty liver   . Hypertension   . kidney calculus   . Morbid obesity (HCC)   . PONV (postoperative nausea and vomiting)   . Reactive airway disease     Past Surgical History:  Procedure Laterality Date  . ABDOMINAL HYSTERECTOMY    . CARDIOVERSION  12/27/2011   Procedure: CARDIOVERSION;  Surgeon: Mirabel Ahlgren C Ameerah Huffstetler, MD;  Location: [facility] OR;  Service: Cardiovascular;  Laterality: N/A;  . CARDIOVERSION  01/17/2012   Procedure: CARDIOVERSION;  Surgeon: Sorayah Schrodt C Jaksen Fiorella, MD;  Location: [facility] OR;  Service: Cardiovascular;  Laterality: N/A;  . DILATION AND CURETTAGE OF UTERUS    . FOOT SURGERY    . TUBAL LIGATION      Current Outpatient Prescriptions  Medication Sig Dispense Refill  . allopurinol (ZYLOPRIM) 300 MG tablet Take 300 mg by mouth daily.    . acetaminophen (TYLENOL) 500 MG tablet Take 500 mg by mouth every 6 (six) hours as needed. Pain     . benazepril (LOTENSIN) 20 MG tablet Take 20 mg by mouth daily.    . ELIQUIS 5 MG TABS tablet TAKE ONE TABLET BY MOUTH TWICE DAILY (  PLEASE  CALL  AND  MAKE  A  ONE  YEAR  FOLLOW  UP  APPOINTMENT) 30 tablet 0  . flecainide (TAMBOCOR) 100 MG tablet TAKE ONE TABLET BY MOUTH TWICE DAILY (NEED  FOLLOW  UP) 60 tablet 1  .  furosemide (LASIX) 20 MG tablet Take 20 mg by mouth daily.    . KLOR-CON M20 20 MEQ tablet TAKE ONE TABLET BY MOUTH ONCE DAILY **NEED OFFICE VISIT** 15 tablet 0  . MAGNESIUM PO Take 400 mg by mouth 2 (two) times daily.     . PROVENTIL HFA 108 (90 BASE) MCG/ACT inhaler INHALE ONE TO TWO PUFFS INTO LUNGS EVERY 6 HOURS AS NEEDED FOR WHEEZING 7 each 2  . UNABLE TO FIND Take 2 tablets by mouth 2 (two) times daily at 10 AM and 5 PM. Mitochondrial NRG     . UNKNOWN TO PATIENT Take 1 Dose by mouth daily.      No current facility-administered medications for this visit.     Allergies  Allergen Reactions  . Nsaids Shortness Of Breath      Review of Systems negative except from HPI and PMH  Physical Exam BP 116/78   Pulse 96   Ht 5' 6" (1.676 m)   Wt 251 lb 3.2 oz (113.9 kg)   SpO2 95%   BMI 40.54 kg/m  Well developed and well nourished in no acute distress HENT normal E scleral and icterus clear Neck Supple JVP flat; carotids brisk and full Clear to   ausculation  Regular rate and rhythm, no murmurs gallops or rub Soft with active bowel sounds No clubbing cyanosis  Edema Alert and oriented, grossly normal motor and sensory function Skin Warm and Dry  ECG demonstrates atrial flutter with 2 to one conduction with a ventricular rate of 96 Intervals-/10/39   Assessment and  Plan Atrial flutter/fibrillation  Flecainide therapy causing fibrillation--flutter  Hypertension    The patient is doing relatively well. She is continuing to work on weight loss and exercise.  She is in atrial flutter today. She is unaware of its duration; she has been taking her ELIQUIS daily and so we will undertake cardioversion. I suggested that she use her pulse oximeter to try to discern recurrence of her atrial arrhythmia changes pulse from her sinus rates which are in the 50-60 range to her flutter rates which are in the 90-100 range.  We will check electrolytes    Current medicines are reviewed  at length with the patient today .  The patient does not have concerns regarding medicines.  

## 2016-03-15 NOTE — Discharge Instructions (Signed)
Electrical Cardioversion, Care After °Refer to this sheet in the next few weeks. These instructions provide you with information on caring for yourself after your procedure. Your health care provider may also give you more specific instructions. Your treatment has been planned according to current medical practices, but problems sometimes occur. Call your health care provider if you have any problems or questions after your procedure. °WHAT TO EXPECT AFTER THE PROCEDURE °After your procedure, it is typical to have the following sensations: °· Some redness on the skin where the shocks were delivered. If this is tender, a sunburn lotion or hydrocortisone cream may help. °· Possible return of an abnormal heart rhythm within hours or days after the procedure. °HOME CARE INSTRUCTIONS °· Take medicines only as directed by your health care provider. Be sure you understand how and when to take your medicine. °· Learn how to feel your pulse and check it often. °· Limit your activity for 48 hours after the procedure or as directed by your health care provider. °· Avoid or minimize caffeine and other stimulants as directed by your health care provider. °SEEK MEDICAL CARE IF: °· You feel like your heart is beating too fast or your pulse is not regular. °· You have any questions about your medicines. °· You have bleeding that will not stop. °SEEK IMMEDIATE MEDICAL CARE IF: °· You are dizzy or feel faint. °· It is hard to breathe or you feel short of breath. °· There is a change in discomfort in your chest. °· Your speech is slurred or you have trouble moving an arm or leg on one side of your body. °· You get a serious muscle cramp that does not go away. °· Your fingers or toes turn cold or blue. °  °This information is not intended to replace advice given to you by your health care provider. Make sure you discuss any questions you have with your health care provider. °  °Document Released: 04/14/2013 Document Revised: 07/15/2014  Document Reviewed: 04/14/2013 °Elsevier Interactive Patient Education ©2016 [employer] ° °

## 2016-03-18 NOTE — Anesthesia Postprocedure Evaluation (Addendum)
Anesthesia Post Note  Patient: Crystal Roth  Procedure(s) Performed: Procedure(s) (LRB): CARDIOVERSION (N/A)  Patient location during evaluation: PACU Anesthesia Type: MAC Level of consciousness: awake and alert Pain management: pain level controlled Vital Signs Assessment: post-procedure vital signs reviewed and stable Respiratory status: spontaneous breathing, nonlabored ventilation, respiratory function stable and patient connected to nasal cannula oxygen Cardiovascular status: stable and blood pressure returned to baseline Anesthetic complications: no     Last Vitals:  Vitals:   03/15/16 1410 03/15/16 1420  BP: (!) 86/46 (!) 90/52  Pulse:    Resp:    Temp:      Last Pain:  Vitals:   03/15/16 1405  TempSrc: Oral   Pain Goal:                 [name]

## 2016-04-01 ENCOUNTER — Telehealth: Payer: Self-pay | Admitting: Internal Medicine

## 2016-04-01 ENCOUNTER — Ambulatory Visit (HOSPITAL_COMMUNITY)
Admission: RE | Admit: 2016-04-01 | Discharge: 2016-04-01 | Disposition: A | Discharge status: Home / Self Care | Payer: No Typology Code available for payment source | Source: Ambulatory Visit | Attending: Nurse Practitioner | Admitting: Nurse Practitioner

## 2016-04-01 ENCOUNTER — Encounter (HOSPITAL_COMMUNITY): Payer: Self-pay | Admitting: Nurse Practitioner

## 2016-04-01 VITALS — BP 100/76 | HR 88 | Ht 66.0 in | Wt 250.8 lb

## 2016-04-01 DIAGNOSIS — I10 Essential (primary) hypertension: Secondary | ICD-10-CM | POA: Diagnosis not present

## 2016-04-01 DIAGNOSIS — J45909 Unspecified asthma, uncomplicated: Secondary | ICD-10-CM | POA: Diagnosis not present

## 2016-04-01 DIAGNOSIS — Z7901 Long term (current) use of anticoagulants: Secondary | ICD-10-CM | POA: Diagnosis not present

## 2016-04-01 DIAGNOSIS — Z90710 Acquired absence of both cervix and uterus: Secondary | ICD-10-CM | POA: Diagnosis not present

## 2016-04-01 DIAGNOSIS — K760 Fatty (change of) liver, not elsewhere classified: Secondary | ICD-10-CM | POA: Diagnosis not present

## 2016-04-01 DIAGNOSIS — I4892 Unspecified atrial flutter: Secondary | ICD-10-CM

## 2016-04-01 DIAGNOSIS — E119 Type 2 diabetes mellitus without complications: Secondary | ICD-10-CM | POA: Insufficient documentation

## 2016-04-01 DIAGNOSIS — Z8261 Family history of arthritis: Secondary | ICD-10-CM | POA: Diagnosis not present

## 2016-04-01 DIAGNOSIS — Z87442 Personal history of urinary calculi: Secondary | ICD-10-CM | POA: Insufficient documentation

## 2016-04-01 DIAGNOSIS — Z886 Allergy status to analgesic agent status: Secondary | ICD-10-CM | POA: Diagnosis not present

## 2016-04-01 DIAGNOSIS — Z6841 Body Mass Index (BMI) 40.0 and over, adult: Secondary | ICD-10-CM | POA: Diagnosis not present

## 2016-04-01 LAB — BASIC METABOLIC PANEL
ANION GAP: 9 (ref 5–15)
BUN: 14 mg/dL (ref 6–20)
CO2: 28 mmol/L (ref 22–32)
Calcium: 9.3 mg/dL (ref 8.9–10.3)
Chloride: 103 mmol/L (ref 101–111)
Creatinine, Ser: 0.66 mg/dL (ref 0.44–1.00)
GFR calc Af Amer: >60 mL/min (ref >60)
GFR calc non Af Amer: >60 mL/min (ref >60)
GLUCOSE: 108 mg/dL — AB (ref 65–99)
POTASSIUM: 3.6 mmol/L (ref 3.5–5.1)
Sodium: 140 mmol/L (ref 135–145)

## 2016-04-01 LAB — CBC
HCT: 44.9 % (ref 36.0–46.0)
Hemoglobin: 14.8 g/dL (ref 12.0–15.0)
MCH: 31 pg (ref 26.0–34.0)
MCHC: 33 g/dL (ref 30.0–36.0)
MCV: 93.9 fL (ref 78.0–100.0)
Platelets: 211 10*3/uL (ref 150–400)
RBC: 4.78 MIL/uL (ref 3.87–5.11)
RDW: 14.4 % (ref 11.5–15.5)
WBC: 5.5 10*3/uL (ref 4.0–10.5)

## 2016-04-01 LAB — MAGNESIUM: Magnesium: 1.7 mg/dL (ref 1.7–2.4)

## 2016-04-01 NOTE — Telephone Encounter (Signed)
Calling stating she had a cardioversion 2 wks ago and has been doing good.  Yesterday she noticed her heart fluttering, swelling in ankles,hands and feet.  HR irreg reading in 80-90's. Today HR still irreg and swelling is slightly better.  Is taking Lasix 20 mg daily. States they are out of town and should be back by 2:00 this afternoon. Called Afib clinic and spoke w/Stacy who states that she can be seen at 3:30 this afternoon.  Gave the appointment time and code to pt and she states that should be good for them to get to that appointment time.

## 2016-04-01 NOTE — Telephone Encounter (Signed)
New Message: ° ° ° °Pt is in atrial fib- °

## 2016-04-02 ENCOUNTER — Other Ambulatory Visit: Payer: Self-pay | Admitting: Internal Medicine

## 2016-04-02 ENCOUNTER — Other Ambulatory Visit: Payer: Self-pay | Admitting: Physician Assistant

## 2016-04-02 ENCOUNTER — Telehealth: Payer: Self-pay | Admitting: Internal Medicine

## 2016-04-02 MED ORDER — FLECAINIDE ACETATE 100 MG PO TABS: ORAL_TABLET | ORAL | 2 refills | Status: DC

## 2016-04-02 NOTE — Progress Notes (Signed)
Primary Care Physician: Lubertha SouthSteve Luking, MD [facility] EP: Dr. Remonia RichterKlein   Crystal Roth is a 65 y.o. female with a h/o  HTN,DM,PAF having done very well without afib burden x 4 years on flecainide. She has h/o morbid obesity but has lost 70 lbs over the last year. CHADS-VASC score is 3(female,HTN,DM).  She saw Dr. Graciela HusbandsKlein 9/7 and was in persistent afib and was set up for  successful cardioversion. She and her husband then went on vacation for the next  week . During this vacation she had swelling, they ate on the road and consumed more salt than usual. Yesterday, she noted her heart fluttering and asked to be seen.   Ekg is similar to EKG prior to cardioversion which Dr. Graciela HusbandsKlein interpreted as aflutter. HR is the upper 80's and pt says she has noted 90's at home. Her normal heart rate is in the 50's. Her fluid status is back to normal today and in fact her weight is down one pound from prior visit with Dr Graciela HusbandsKlein. She has never been on any other antiarrythmic, left atrial size is 58 mm.  Today, she denies symptoms of   chest pain, shortness of breath, orthopnea, PND, lower extremity edema, dizziness, presyncope, syncope, or neurologic sequela.Positive for palpitations, edema which is improved. The patient is tolerating medications without difficulties and is otherwise without complaint today.   Past Medical History:  Diagnosis Date  . Atrial fibrillation (HCC)    dx 5/13; Apixaban; echo 5/13: mild LVH, EF 60-65%, mod LAe, mild RAE, PASP 29  . Diabetes mellitus   . Fatty liver   . Hypertension   . kidney calculus   . Morbid obesity (HCC)   . PONV (postoperative nausea and vomiting)   . Reactive airway disease    Past Surgical History:  Procedure Laterality Date  . ABDOMINAL HYSTERECTOMY    . CARDIOVERSION  12/27/2011   Procedure: CARDIOVERSION;  Surgeon: Duke SalviaSteven C Klein, MD;  [facility];  Service: Cardiovascular;  Laterality: N/A;  . CARDIOVERSION  01/17/2012   Procedure: CARDIOVERSION;  Surgeon: Duke SalviaSteven C Klein, MD;  [facility];  Service: Cardiovascular;  Laterality: N/A;  . CARDIOVERSION N/A 03/15/2016   Procedure: CARDIOVERSION;  Surgeon: Vesta MixerPhilip J Nahser, MD;  [facility];  Service: Cardiovascular;  Laterality: N/A;  . DILATION AND CURETTAGE OF UTERUS    . FOOT SURGERY    . TUBAL LIGATION      Current Outpatient Prescriptions  Medication Sig Dispense Refill  . acetaminophen (TYLENOL) 500 MG tablet Take 500 mg by mouth every 6 (six) hours as needed. Pain     . allopurinol (ZYLOPRIM) 300 MG tablet Take 300 mg by mouth daily.    . benazepril (LOTENSIN) 20 MG tablet Take 20 mg by mouth daily.    . Cholecalciferol (VITAMIN D3) 5000 units TBDP Take by mouth.    . Cyanocobalamin (B-12 COMPLIANCE INJECTION IJ) Inject as directed.    Marland Kitchen. ELIQUIS 5 MG TABS tablet TAKE ONE TABLET BY MOUTH TWICE DAILY (  PLEASE  CALL  AND  MAKE  A  ONE  YEAR  FOLLOW  UP  APPOINTMENT) (Patient taking differently: TAKE ONE TABLET BY MOUTH TWICE DAILY) 30 tablet 0  . flecainide (TAMBOCOR) 100 MG tablet TAKE ONE TABLET BY MOUTH TWICE DAILY (NEED  FOLLOW  UP) (Patient taking differently: TAKE ONE TABLET BY MOUTH TWICE DAILY) 60 tablet 1  . furosemide (LASIX) 20 MG tablet Take 20 mg by  mouth daily.    Marland Kitchen KLOR-CON M20 20 MEQ tablet TAKE ONE TABLET BY MOUTH ONCE DAILY **NEED OFFICE VISIT** 15 tablet 0  . MAGNESIUM PO Take 400 mg by mouth 2 (two) times daily.     . Multiple Vitamins-Minerals (OCUVITE EXTRA PO) Take by mouth.    Marland Kitchen PROVENTIL HFA 108 (90 BASE) MCG/ACT inhaler INHALE ONE TO TWO PUFFS INTO LUNGS EVERY 6 HOURS AS NEEDED FOR WHEEZING 7 each 2  . UNKNOWN TO PATIENT Take 1 Dose by mouth daily.     Marland Kitchen VITAMIN B COMPLEX-C CAPS Take by mouth.    Marland Kitchen UNABLE TO FIND Take 2 tablets by mouth 2 (two) times daily at 10 AM and 5 PM. Mitochondrial NRG      No current facility-administered medications for this encounter.     Allergies  Allergen Reactions  . Nsaids  Shortness Of Breath    Social History   Social History  . Marital status: Married    Spouse name: N/A  . Number of children: N/A  . Years of education: N/A   Occupational History  . Not on file.   Social History Main Topics  . Smoking status: Never Smoker  . Smokeless tobacco: Never Used  . Alcohol use No  . Drug use: No  . Sexual activity: Not on file   Other Topics Concern  . Not on file   Social History Narrative  . No narrative on file    Family History  Problem Relation Age of Onset  . Parkinsonism Mother   . Osteoarthritis Father     ROS- All systems are reviewed and negative except as per the HPI above  Physical Exam: Vitals:   04/01/16 1535  BP: 100/76  Pulse: 88  Weight: 250 lb 12.8 oz (113.8 kg)  Height: 5\' 6"  (1.676 m)    GEN- The patient is well appearing, alert and oriented x 3 today.   Head- normocephalic, atraumatic Eyes-  Sclera clear, conjunctiva pink Ears- hearing intact Oropharynx- clear Neck- supple, no JVP Lymph- no cervical lymphadenopathy Lungs- Clear to ausculation bilaterally, normal work of breathing Heart- Regular rate and rhythm, no murmurs, rubs or gallops, PMI not laterally displaced GI- soft, NT, ND, + BS Extremities- no clubbing, cyanosis, or edema MS- no significant deformity or atrophy Skin- no rash or lesion Psych- euthymic mood, full affect Neuro- strength and sensation are intact  EKG- atrial flutter at 88 bpm, pr int 184 ms, qtc 462 ms Epic records reviewed  Assessment and Plan: 1. Atrial flutter Pt with elevated heart rate, compared to last EKG with Dr. Graciela Husbands, is similar and Dr. Graciela Husbands interpreted as a flutter. Heat rates definitely higher than pt's normal in the 50'-60's. Discussed with pt other antiarrythmic's, if considered pt  failing flecainide - would have to choose one that is rate neutral since she is slow at baseline. Best option may be tikosyn Do not think with Left atrial enlargement of 58 mm she  would be an ablation candidate There is a good chance that traveling and being out of her routine, consuming more salt than usual could have undermined recent cardioversion  The pt is willing to try another cardioversion before deciding to switch to another antiarrythmic   Will discuss with Dr. Graciela Husbands best way to approach Cbc, bmet, mag today No missed doses of eliquis  Donna C. Matthew Folks [facility] [address] [place], [place] [address] [phone]

## 2016-04-02 NOTE — Telephone Encounter (Signed)
Crystal Roth is calling because she is not sure id she is to return to work , she is not feeling well and was expecting a call from the NP . At some point she states that she is going to need a back to work note . Please call   Thanks

## 2016-04-04 ENCOUNTER — Other Ambulatory Visit (HOSPITAL_COMMUNITY): Payer: Self-pay | Admitting: *Deleted

## 2016-04-04 NOTE — Telephone Encounter (Signed)
Will forward to [name], NP to review as she saw her on 04/01/16. I am unsure of any discussions they may have had about work.

## 2016-04-04 NOTE — Telephone Encounter (Signed)
Pt has been set up for cardioversion 10/2 per Dr. Graciela HusbandsKlein and will return to work 10/3. Pt states will see how this plan goes and will have FMLA paper work processed if needed going forward.

## 2016-04-08 ENCOUNTER — Encounter (HOSPITAL_COMMUNITY): Payer: Self-pay | Admitting: *Deleted

## 2016-04-08 ENCOUNTER — Encounter (HOSPITAL_COMMUNITY)
Admission: RE | Disposition: A | Discharge status: Home / Self Care | Payer: Self-pay | Source: Ambulatory Visit | Attending: Cardiology

## 2016-04-08 ENCOUNTER — Ambulatory Visit (HOSPITAL_COMMUNITY)
Admission: RE | Admit: 2016-04-08 | Discharge: 2016-04-08 | Disposition: A | Discharge status: Home / Self Care | Payer: No Typology Code available for payment source | Source: Ambulatory Visit | Attending: Cardiology | Admitting: Cardiology

## 2016-04-08 ENCOUNTER — Encounter (HOSPITAL_COMMUNITY): Payer: Self-pay | Admitting: Certified Registered Nurse Anesthetist

## 2016-04-08 DIAGNOSIS — Z538 Procedure and treatment not carried out for other reasons: Secondary | ICD-10-CM | POA: Diagnosis not present

## 2016-04-08 DIAGNOSIS — I4892 Unspecified atrial flutter: Secondary | ICD-10-CM | POA: Diagnosis present

## 2016-04-08 SURGERY — CANCELLED PROCEDURE

## 2016-04-08 NOTE — Progress Notes (Signed)
Patient came into for cardioversion. Patient was found to be in a NSR. Dr. Delton SeeNelson was notified and 12 lead EKG confirmed NSR. Patient instructed to keep all follow-up appointments and procedure is cancelled today.

## 2016-04-15 ENCOUNTER — Ambulatory Visit (HOSPITAL_COMMUNITY)
Admission: RE | Admit: 2016-04-15 | Discharge: 2016-04-15 | Disposition: A | Discharge status: Home / Self Care | Payer: No Typology Code available for payment source | Source: Ambulatory Visit | Attending: Nurse Practitioner | Admitting: Nurse Practitioner

## 2016-04-15 ENCOUNTER — Encounter (HOSPITAL_COMMUNITY): Payer: Self-pay | Admitting: Nurse Practitioner

## 2016-04-15 VITALS — BP 124/76 | HR 94 | Ht 66.0 in | Wt 250.2 lb

## 2016-04-15 DIAGNOSIS — I481 Persistent atrial fibrillation: Secondary | ICD-10-CM

## 2016-04-15 DIAGNOSIS — I10 Essential (primary) hypertension: Secondary | ICD-10-CM | POA: Diagnosis not present

## 2016-04-15 DIAGNOSIS — Z7901 Long term (current) use of anticoagulants: Secondary | ICD-10-CM | POA: Diagnosis not present

## 2016-04-15 DIAGNOSIS — I484 Atypical atrial flutter: Secondary | ICD-10-CM | POA: Insufficient documentation

## 2016-04-15 DIAGNOSIS — K760 Fatty (change of) liver, not elsewhere classified: Secondary | ICD-10-CM | POA: Diagnosis not present

## 2016-04-15 DIAGNOSIS — Z87442 Personal history of urinary calculi: Secondary | ICD-10-CM | POA: Insufficient documentation

## 2016-04-15 DIAGNOSIS — Z8262 Family history of osteoporosis: Secondary | ICD-10-CM | POA: Diagnosis not present

## 2016-04-15 DIAGNOSIS — I4891 Unspecified atrial fibrillation: Secondary | ICD-10-CM | POA: Diagnosis not present

## 2016-04-15 DIAGNOSIS — Z6841 Body Mass Index (BMI) 40.0 and over, adult: Secondary | ICD-10-CM | POA: Insufficient documentation

## 2016-04-15 DIAGNOSIS — E119 Type 2 diabetes mellitus without complications: Secondary | ICD-10-CM | POA: Insufficient documentation

## 2016-04-15 DIAGNOSIS — Z820 Family history of epilepsy and other diseases of the nervous system: Secondary | ICD-10-CM | POA: Insufficient documentation

## 2016-04-15 DIAGNOSIS — Z90710 Acquired absence of both cervix and uterus: Secondary | ICD-10-CM | POA: Insufficient documentation

## 2016-04-15 DIAGNOSIS — Z886 Allergy status to analgesic agent status: Secondary | ICD-10-CM | POA: Insufficient documentation

## 2016-04-15 DIAGNOSIS — J45909 Unspecified asthma, uncomplicated: Secondary | ICD-10-CM | POA: Diagnosis not present

## 2016-04-15 DIAGNOSIS — I4819 Other persistent atrial fibrillation: Secondary | ICD-10-CM

## 2016-04-15 MED ORDER — FUROSEMIDE 20 MG PO TABS: 20.0000 mg | ORAL_TABLET | Freq: Every day | ORAL | 3 refills | Status: DC

## 2016-04-15 MED ORDER — METOPROLOL TARTRATE 25 MG PO TABS: 25.0000 mg | ORAL_TABLET | Freq: Two times a day (BID) | ORAL | 3 refills | Status: DC

## 2016-04-15 NOTE — Patient Instructions (Signed)
Your physician has recommended you make the following change in your medication:  1)Start metoprolol 25mg  twice a day

## 2016-04-15 NOTE — Progress Notes (Signed)
Primary Care Physician: Lubertha SouthSteve Luking, MD Primary Electrophysiologist: Remonia RichterKlein  Crystal Roth is a 65 y.o. female with a history of atrial flutter who presents [facility]. She had recurrent atrial flutter and was scheduled for cardioversion but on arrival was found to be in SR.  She then had recurrent atrial flutter on Friday and has been persistently out of rhythm since then.  She is not as symptomatic but has had increased fluid retention and some exercise intolerance.  Today, she denies symptoms of chest pain, shortness of breath, orthopnea, PND, dizziness, presyncope, syncope, snoring, daytime somnolence, bleeding, or neurologic sequela. The patient is tolerating medications without difficulties and is otherwise without complaint today.    Past Medical History:  Diagnosis Date  . Atrial fibrillation (HCC)    dx 5/13; Apixaban; echo 5/13: mild LVH, EF 60-65%, mod LAe, mild RAE, PASP 29  . Diabetes mellitus   . Fatty liver   . Hypertension   . kidney calculus   . Morbid obesity (HCC)   . PONV (postoperative nausea and vomiting)   . Reactive airway disease    Past Surgical History:  Procedure Laterality Date  . ABDOMINAL HYSTERECTOMY    . CARDIOVERSION  12/27/2011   Procedure: CARDIOVERSION;  Surgeon: Duke SalviaSteven C Klein, MD;  [facility];  Service: Cardiovascular;  Laterality: N/A;  . CARDIOVERSION  01/17/2012   Procedure: CARDIOVERSION;  Surgeon: Duke SalviaSteven C Klein, MD;  [facility];  Service: Cardiovascular;  Laterality: N/A;  . CARDIOVERSION N/A 03/15/2016   Procedure: CARDIOVERSION;  Surgeon: Vesta MixerPhilip J Nahser, MD;  [facility];  Service: Cardiovascular;  Laterality: N/A;  . DILATION AND CURETTAGE OF UTERUS    . FOOT SURGERY    . TUBAL LIGATION      Current Outpatient Prescriptions  Medication Sig Dispense Refill  . acetaminophen (TYLENOL) 500 MG tablet Take 500 mg by mouth every 6 (six) hours as needed. Pain     .  allopurinol (ZYLOPRIM) 300 MG tablet Take 300 mg by mouth daily.    . benazepril (LOTENSIN) 20 MG tablet Take 20 mg by mouth daily.    . Cholecalciferol (VITAMIN D3) 5000 units TBDP Take by mouth.    . Cyanocobalamin (B-12 COMPLIANCE INJECTION IJ) Inject as directed.    Marland Kitchen. ELIQUIS 5 MG TABS tablet TAKE ONE TABLET BY MOUTH TWICE DAILY (CALL  AND  MAKE  A  ONE  YEAR  FOLLOW  UP  APPOINTMENT) 180 tablet 3  . flecainide (TAMBOCOR) 100 MG tablet TAKE ONE TABLET BY MOUTH TWICE DAILY (NEED  FOLLOW  UP) 60 tablet 2  . furosemide (LASIX) 20 MG tablet Take 20 mg by mouth daily.    Marland Kitchen. KLOR-CON M20 20 MEQ tablet TAKE ONE TABLET BY MOUTH ONCE DAILY **NEED OFFICE VISIT** 15 tablet 0  . MAGNESIUM PO Take 400 mg by mouth 2 (two) times daily.     . Multiple Vitamins-Minerals (OCUVITE EXTRA PO) Take by mouth.    Marland Kitchen. PROVENTIL HFA 108 (90 BASE) MCG/ACT inhaler INHALE ONE TO TWO PUFFS INTO LUNGS EVERY 6 HOURS AS NEEDED FOR WHEEZING 7 each 2  . UNABLE TO FIND Take 2 tablets by mouth 2 (two) times daily at 10 AM and 5 PM. Mitochondrial NRG     . UNKNOWN TO PATIENT Take 1 Dose by mouth daily.     Marland Kitchen. VITAMIN B COMPLEX-C CAPS Take by mouth.     No current facility-administered medications for  this encounter.     Allergies  Allergen Reactions  . Nsaids Shortness Of Breath    Social History   Social History  . Marital status: Married    Spouse name: N/A  . Number of children: N/A  . Years of education: N/A   Occupational History  . Not on file.   Social History Main Topics  . Smoking status: Never Smoker  . Smokeless tobacco: Never Used  . Alcohol use No  . Drug use: No  . Sexual activity: Not on file   Other Topics Concern  . Not on file   Social History Narrative  . No narrative on file    Family History  Problem Relation Age of Onset  . Parkinsonism Mother   . Osteoarthritis Father     ROS- All systems are reviewed and negative except as per the HPI above.  Physical Exam: Vitals:    04/15/16 0844  BP: 124/76  Pulse: 94  Weight: 250 lb 3.2 oz (113.5 kg)  Height: 5\' 6"  (1.676 m)    GEN- The patient is obese appearing, alert and oriented x 3 today.   Head- normocephalic, atraumatic Eyes-  Sclera clear, conjunctiva pink Ears- hearing intact Oropharynx- clear Neck- supple  Lungs- Clear to ausculation bilaterally, normal work of breathing Heart- Regular rate and rhythm  GI- soft, NT, ND, + BS Extremities- no clubbing, cyanosis, or edema MS- no significant deformity or atrophy Skin- no rash or lesion Psych- euthymic mood, full affect Neuro- strength and sensation are intact  Wt Readings from Last 3 Encounters:  04/15/16 250 lb 3.2 oz (113.5 kg)  04/01/16 250 lb 12.8 oz (113.8 kg)  03/14/16 251 lb 3.2 oz (113.9 kg)    EKG today demonstrates atypical atrial flutter, ventricular rate 94, QTc Echo 11/2011 demonstrated EF 60-65%, no RWMA, LA 58  Epic records are reviewed at length today  Assessment and Plan:  1. Atypical atrial flutter Recurrent and symptomatic today. She is not as symptomatic as she was with initial recurrence a couple of weeks ago. For now, will continue Flecainide, update echo and add Metoprolol tartrate 25mg  twice daily.  Follow up in 2 weeks. If she is in persistent flutter at that point, can consider alternate AAD therapy vs ablation.  Her QTc is sinus is around while on Flecainide.  Continue Eliquis for CHADS2VASC of 3  2. Morbid obesity Body mass index is 40.38 kg/m. As above, lifestyle modification was discussed at length including regular exercise and weight reduction. She continues to work on weight loss.   3. HTN Stable No change required today Add BB as above   Plan: add Metoprolol tartrate 25mg  twice daily, update echo, follow up in AF clinic in 2 weeks.   Gypsy Balsam, NP 04/15/2016 8:46 AM

## 2016-04-19 ENCOUNTER — Ambulatory Visit (HOSPITAL_COMMUNITY)
Admission: RE | Admit: 2016-04-19 | Discharge: 2016-04-19 | Disposition: A | Discharge status: Home / Self Care | Payer: No Typology Code available for payment source | Source: Ambulatory Visit | Attending: Nurse Practitioner | Admitting: Nurse Practitioner

## 2016-04-19 DIAGNOSIS — I422 Other hypertrophic cardiomyopathy: Secondary | ICD-10-CM | POA: Insufficient documentation

## 2016-04-19 DIAGNOSIS — I517 Cardiomegaly: Secondary | ICD-10-CM | POA: Insufficient documentation

## 2016-04-19 DIAGNOSIS — I348 Other nonrheumatic mitral valve disorders: Secondary | ICD-10-CM | POA: Insufficient documentation

## 2016-04-19 DIAGNOSIS — I481 Persistent atrial fibrillation: Secondary | ICD-10-CM | POA: Insufficient documentation

## 2016-04-19 DIAGNOSIS — I4819 Other persistent atrial fibrillation: Secondary | ICD-10-CM

## 2016-04-19 NOTE — Progress Notes (Signed)
  Echocardiogram 2D Echocardiogram has been performed.  Leta JunglingCooper, Alycen Mack M 04/19/2016, 9:42 AM

## 2016-04-30 ENCOUNTER — Encounter (HOSPITAL_COMMUNITY): Payer: Self-pay | Admitting: Nurse Practitioner

## 2016-04-30 ENCOUNTER — Ambulatory Visit (HOSPITAL_COMMUNITY)
Admission: RE | Admit: 2016-04-30 | Discharge: 2016-04-30 | Disposition: A | Discharge status: Home / Self Care | Payer: No Typology Code available for payment source | Source: Ambulatory Visit | Attending: Nurse Practitioner | Admitting: Nurse Practitioner

## 2016-04-30 VITALS — BP 124/72 | HR 49 | Ht 66.0 in | Wt 272.0 lb

## 2016-04-30 DIAGNOSIS — I4891 Unspecified atrial fibrillation: Secondary | ICD-10-CM | POA: Diagnosis present

## 2016-04-30 DIAGNOSIS — I4892 Unspecified atrial flutter: Secondary | ICD-10-CM

## 2016-04-30 DIAGNOSIS — R9431 Abnormal electrocardiogram [ECG] [EKG]: Secondary | ICD-10-CM | POA: Diagnosis not present

## 2016-04-30 MED ORDER — METOPROLOL TARTRATE 25 MG PO TABS: 12.5000 mg | ORAL_TABLET | Freq: Two times a day (BID) | ORAL | 3 refills | Status: DC

## 2016-04-30 MED ORDER — FUROSEMIDE 20 MG PO TABS: 20.0000 mg | ORAL_TABLET | Freq: Two times a day (BID) | ORAL | 3 refills | Status: DC

## 2016-04-30 NOTE — Progress Notes (Signed)
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 female with a h/o  HTN,DM,PAF having done very well without afib burden x 4 years on flecainide. She has h/o morbid obesity but has lost 70 lbs over the last year. CHADS-VASC score is 3(female,HTN,DM).  She saw Dr. Graciela Husbands 9/7 and was in persistent afib and was set up for  successful cardioversion. She and her husband then went on vacation for the next  week . During this vacation she had swelling, they ate on the road and consumed more salt than usual. Yesterday, she noted her heart fluttering and asked to be seen.   Ekg is similar to EKG prior to cardioversion which Dr. Graciela Husbands interpreted as aflutter. HR is the upper 80's and pt says she has noted 90's at home. Her normal heart rate is in the 50's. Her fluid status is back to normal today and in fact her weight is down one pound from prior visit with Dr Graciela Husbands. She has never been on any other antiarrythmic, left atrial size is 58 mm.  She presented in SR on day of cardioversion and it was cancelled. She then saw Gypsy Balsam, NP, two weeks ago, at which time she had returned to atrial flutter. She was placed on metoprolol tartrate 25 mg bid and after a few days returned to Sinus brady. Her heart rate has been in the upper 40's/low 50's. She has gained 22 lbs over the last two weeks, feels somewhat bloated but no shortness of breath or significant LLE. No PND/orthopnea. Pt sates that she has a very low metabolism,and although she states that she has not eaten any different,  a very low heart rate as well as not has not exercising for the last 5-6 weeks has probably contributed to weight gain. Echo updated and has normal EF, left atrium showed improvement, from 58 mm to 40 mm.Pt states before BB, heart rate usually ran in the 70's.  Today, she denies symptoms of   chest pain, shortness of breath, orthopnea, PND  dizziness, presyncope,  syncope, or neurologic sequela.Positive for palpitations, edema which is improved. Mild LEE, facial puffiness.   Past Medical History:  Diagnosis Date  . Atrial fibrillation (HCC)    dx 5/13; Apixaban; echo 5/13: mild LVH, EF 60-65%, mod LAe, mild RAE, PASP 29  . Diabetes mellitus   . Fatty liver   . Hypertension   . kidney calculus   . Morbid obesity (HCC)   . PONV (postoperative nausea and vomiting)   . Reactive airway disease    Past Surgical History:  Procedure Laterality Date  . ABDOMINAL HYSTERECTOMY    . CARDIOVERSION  12/27/2011   Procedure: CARDIOVERSION;  Surgeon: Duke Salvia, MD;  Location: [facility] OR;  Service: Cardiovascular;  Laterality: N/A;  . CARDIOVERSION  01/17/2012   Procedure: CARDIOVERSION;  Surgeon: Duke Salvia, MD;  Location: [facility] OR;  Service: Cardiovascular;  Laterality: N/A;  . CARDIOVERSION N/A 03/15/2016   Procedure: CARDIOVERSION;  Surgeon: [name], MD;  Location: [facility] ENDOSCOPY;  Service: Cardiovascular;  Laterality: N/A;  . DILATION AND CURETTAGE OF UTERUS    . FOOT SURGERY    . TUBAL LIGATION      Current Outpatient Prescriptions  Medication Sig Dispense Refill  . acetaminophen (TYLENOL) 500 MG tablet Take 500 mg by mouth every 6 (six) hours as needed. Pain     . allopurinol (ZYLOPRIM) 300  MG tablet Take 300 mg by mouth daily.    . benazepril (LOTENSIN) 20 MG tablet Take 20 mg by mouth daily.    . Cholecalciferol (VITAMIN D3) 5000 units TBDP Take by mouth.    . Cyanocobalamin (B-12 COMPLIANCE INJECTION IJ) Inject as directed.    Marland Kitchen ELIQUIS 5 MG TABS tablet TAKE ONE TABLET BY MOUTH TWICE DAILY (CALL  AND  MAKE  A  ONE  YEAR  FOLLOW  UP  APPOINTMENT) 180 tablet 3  . flecainide (TAMBOCOR) 100 MG tablet TAKE ONE TABLET BY MOUTH TWICE DAILY (NEED  FOLLOW  UP) 60 tablet 2  . furosemide (LASIX) 20 MG tablet Take 1 tablet (20 mg total) by mouth 2 (two) times daily. Take 20 mg twice a day for 5 days and then resume once daily 30 tablet 3  . KLOR-CON M20  20 MEQ tablet TAKE ONE TABLET BY MOUTH ONCE DAILY **NEED OFFICE VISIT** (Patient taking differently: TAKE ONE TABLET BY MOUTH ONCE DAILY) 15 tablet 0  . MAGNESIUM PO Take 400 mg by mouth 2 (two) times daily.     . metoprolol tartrate (LOPRESSOR) 25 MG tablet Take 0.5 tablets (12.5 mg total) by mouth 2 (two) times daily. Take 0.5 tablet twice a day for 1 week and slowly wean off. 60 tablet 3  . Multiple Vitamins-Minerals (OCUVITE EXTRA PO) Take by mouth.    Marland Kitchen PROVENTIL HFA 108 (90 BASE) MCG/ACT inhaler INHALE ONE TO TWO PUFFS INTO LUNGS EVERY 6 HOURS AS NEEDED FOR WHEEZING 7 each 2  . UNABLE TO FIND Take 2 tablets by mouth 2 (two) times daily at 10 AM and 5 PM. Mitochondrial NRG     . UNKNOWN TO PATIENT Take 1 Dose by mouth daily.     Marland Kitchen VITAMIN B COMPLEX-C CAPS Take by mouth.     No current facility-administered medications for this encounter.     Allergies  Allergen Reactions  . Nsaids Shortness Of Breath    Social History   Social History  . Marital status: Married    Spouse name: N/A  . Number of children: N/A  . Years of education: N/A   Occupational History  . Not on file.   Social History Main Topics  . Smoking status: Never Smoker  . Smokeless tobacco: Never Used  . Alcohol use No  . Drug use: No  . Sexual activity: Not on file   Other Topics Concern  . Not on file   Social History Narrative  . No narrative on file    Family History  Problem Relation Age of Onset  . Parkinsonism Mother   . Osteoarthritis Father     ROS- All systems are reviewed and negative except as per the HPI above  Physical Exam: Vitals:   04/30/16 0844  BP: 124/72  Pulse: (!) 49  Weight: 272 lb (123.4 kg)  Height: 5\' 6"  (1.676 m)    GEN- The patient is well appearing, alert and oriented x 3 today.   Head- normocephalic, atraumatic Eyes-  Sclera clear, conjunctiva pink Ears- hearing intact Oropharynx- clear Neck- supple, no JVP Lymph- no cervical lymphadenopathy Lungs- Clear  to ausculation bilaterally, normal work of breathing Heart-Slow, regular rate and rhythm, no murmurs, rubs or gallops, PMI not laterally displaced GI- soft, NT, ND, + BS Extremities- no clubbing, cyanosis, or edema MS- no significant deformity or atrophy Skin- no rash or lesion Psych- euthymic mood, full affect Neuro- strength and sensation are intact  EKG- sinus brady at 48  bpm, qrs int 82 ms, qtc 435 ms Epic records reviewed  Assessment and Plan: 1.  Paroxsymal Atrial flutter Lopressor 25 mg bid added two weeks ago with return to sinus brady and pt has gained 22 lbs without significant change in eating pattern Think it is probably due to low heart rate and low  metabolism Cut lopressor in half and wean off after one week, can use as needed if aflutter returns Increase lasix to 20 mg bid x 5 days If continues to have frequent breakthrough in rhythm,, may have  to consider change in antiarrythmic or consideration for ablation  F/u with PCP on Friday Afib clinic in two weeks    Lupita LeashDonna C. Matthew Folksarroll, ANP-C [facility] Clinic [facility] [address] [place], [place] [address] [phone]

## 2016-04-30 NOTE — Patient Instructions (Signed)
Your physician has recommended you make the following change in your medication: Increase lasix to 20 mg twice a day for 5 days then resume once daily.  Decrease the metoprolol to 12.5 mg twice a day for 5 days, then slowly wean off.  Follow up with our office in 2 weeks.  This appt has been scheduled.

## 2016-05-14 ENCOUNTER — Encounter (HOSPITAL_COMMUNITY): Payer: Self-pay | Admitting: Nurse Practitioner

## 2016-05-14 ENCOUNTER — Ambulatory Visit (HOSPITAL_COMMUNITY)
Admission: RE | Admit: 2016-05-14 | Discharge: 2016-05-14 | Disposition: A | Discharge status: Home / Self Care | Payer: No Typology Code available for payment source | Source: Ambulatory Visit | Attending: Nurse Practitioner | Admitting: Nurse Practitioner

## 2016-05-14 VITALS — BP 124/76 | HR 71 | Ht 66.0 in | Wt 263.0 lb

## 2016-05-14 DIAGNOSIS — Z90710 Acquired absence of both cervix and uterus: Secondary | ICD-10-CM | POA: Insufficient documentation

## 2016-05-14 DIAGNOSIS — E119 Type 2 diabetes mellitus without complications: Secondary | ICD-10-CM | POA: Insufficient documentation

## 2016-05-14 DIAGNOSIS — I480 Paroxysmal atrial fibrillation: Secondary | ICD-10-CM | POA: Diagnosis not present

## 2016-05-14 DIAGNOSIS — Z79899 Other long term (current) drug therapy: Secondary | ICD-10-CM | POA: Diagnosis not present

## 2016-05-14 DIAGNOSIS — Z7984 Long term (current) use of oral hypoglycemic drugs: Secondary | ICD-10-CM | POA: Insufficient documentation

## 2016-05-14 DIAGNOSIS — I10 Essential (primary) hypertension: Secondary | ICD-10-CM | POA: Diagnosis not present

## 2016-05-14 DIAGNOSIS — Z7901 Long term (current) use of anticoagulants: Secondary | ICD-10-CM | POA: Insufficient documentation

## 2016-05-14 DIAGNOSIS — Z9889 Other specified postprocedural states: Secondary | ICD-10-CM | POA: Insufficient documentation

## 2016-05-14 NOTE — Progress Notes (Signed)
Primary Care Physician: Lubertha SouthSteve Luking, MD [facility] EP: Dr. Remonia RichterKlein   Tamakia J Flaim is a 65 y.o. female with a h/o  HTN,DM,PAF having done very well without afib burden x 4 years on flecainide. She has h/o morbid obesity but has lost 70 lbs over the last year. CHADS-VASC score is 3(female,HTN,DM).  She saw Dr. Graciela HusbandsKlein 9/7 and was in persistent afib and was set up for  successful cardioversion. She and her husband then went on vacation for the next  week . During this vacation she had swelling, they ate on the road and consumed more salt than usual. Yesterday, she noted her heart fluttering and asked to be seen.   Ekg is similar to EKG prior to cardioversion which Dr. Graciela HusbandsKlein interpreted as aflutter. HR is the upper 80's and pt says she has noted 90's at home. Her normal heart rate is in the 50's. Her fluid status is back to normal today and in fact her weight is down one pound from prior visit with Dr Graciela HusbandsKlein. She has never been on any other antiarrythmic, left atrial size is 58 mm.  She presented in SR on day of cardioversion and it was cancelled. She then saw Gypsy BalsamAmber Seiler, NP, two weeks ago, at which time she had returned to atrial flutter. She was placed on metoprolol tartrate 25 mg bid and after a few days returned to Sinus brady. Her heart rate has been in the upper 40's/low 50's. She has gained 22 lbs over the last two weeks, feels somewhat bloated but no shortness of breath or significant LLE. No PND/orthopnea. Pt sates that she has a very low metabolism,and although she states that she has not eaten any different,  a very low heart rate as well as not  exercising for the last 5-6 weeks has probably contributed to weight gain. Echo updated and has normal EF, left atrium showed improvement, from 58 mm to 40 mm.Pt states before BB, heart rate usually ran in the 70's.  She returns 11/7 for f/u in afib clinic. On last visit, she was back in rhythm but had gained 22 lbs which she  contributed to the BB. She has since stopped drug and has lost 9 lbs over the last 2 weeks. She continues on flecainide 100 mg bid and Eliquis 5 mg bid.  Today, she denies symptoms of   chest pain, shortness of breath, orthopnea, PND  dizziness, presyncope, syncope, or neurologic sequela.Positive for palpitations, edema which is improved. Mild LEE, facial puffiness.   Past Medical History:  Diagnosis Date  . Atrial fibrillation (HCC)    dx 5/13; Apixaban; echo 5/13: mild LVH, EF 60-65%, mod LAe, mild RAE, PASP 29  . Diabetes mellitus   . Fatty liver   . Hypertension   . kidney calculus   . Morbid obesity (HCC)   . PONV (postoperative nausea and vomiting)   . Reactive airway disease    Past Surgical History:  Procedure Laterality Date  . ABDOMINAL HYSTERECTOMY    . CARDIOVERSION  12/27/2011   Procedure: CARDIOVERSION;  Surgeon: Duke SalviaSteven C Klein, MD;  [facility];  Service: Cardiovascular;  Laterality: N/A;  . CARDIOVERSION  01/17/2012   Procedure: CARDIOVERSION;  Surgeon: Duke SalviaSteven C Klein, MD;  [facility];  Service: Cardiovascular;  Laterality: N/A;  . CARDIOVERSION N/A 03/15/2016   Procedure: CARDIOVERSION;  Surgeon: Vesta MixerPhilip J Nahser, MD;  [facility];  Service: Cardiovascular;  Laterality: N/A;  . DILATION AND CURETTAGE  OF UTERUS    . FOOT SURGERY    . TUBAL LIGATION      Current Outpatient Prescriptions  Medication Sig Dispense Refill  . acetaminophen (TYLENOL) 500 MG tablet Take 500 mg by mouth every 6 (six) hours as needed. Pain     . allopurinol (ZYLOPRIM) 300 MG tablet Take 300 mg by mouth daily.    . benazepril (LOTENSIN) 20 MG tablet Take 20 mg by mouth daily.    . Cholecalciferol (VITAMIN D3) 5000 units TBDP Take by mouth.    . co-enzyme Q-10 30 MG capsule Take 30 mg by mouth 3 (three) times daily.    . Cyanocobalamin (B-12 COMPLIANCE INJECTION IJ) Inject as directed.    Marland Kitchen ELIQUIS 5 MG TABS tablet TAKE ONE TABLET BY MOUTH TWICE DAILY (CALL  AND  MAKE  A  ONE   YEAR  FOLLOW  UP  APPOINTMENT) 180 tablet 3  . flecainide (TAMBOCOR) 100 MG tablet TAKE ONE TABLET BY MOUTH TWICE DAILY (NEED  FOLLOW  UP) 60 tablet 2  . furosemide (LASIX) 20 MG tablet Take 1 tablet (20 mg total) by mouth 2 (two) times daily. Take 20 mg twice a day for 5 days and then resume once daily (Patient taking differently: Take 20 mg by mouth 2 (two) times daily between meals as needed. Alternating with the torsemide.  Do not take furosemide and torsemide together) 30 tablet 3  . KLOR-CON M20 20 MEQ tablet TAKE ONE TABLET BY MOUTH ONCE DAILY **NEED OFFICE VISIT** (Patient taking differently: TAKE ONE TABLET BY MOUTH ONCE DAILY) 15 tablet 0  . MAGNESIUM PO Take 400 mg by mouth 2 (two) times daily.     . metFORMIN (GLUCOPHAGE) 500 MG tablet Take by mouth 2 (two) times daily with a meal. 1 to 2 tablets twice a day    . Multiple Vitamins-Minerals (OCUVITE EXTRA PO) Take by mouth.    . torsemide (DEMADEX) 10 MG tablet Take 10 mg by mouth daily. Take an extra 10 mg if needed for fluid gain >3 lbs; alternate with the furosemide.  Do not take torsemide and furosemide together    . UNABLE TO FIND Take 2 tablets by mouth 2 (two) times daily at 10 AM and 5 PM. Mitochondrial NRG     . UNKNOWN TO PATIENT Take 1 Dose by mouth daily.     Marland Kitchen VITAMIN B COMPLEX-C CAPS Take by mouth.    Marland Kitchen PROVENTIL HFA 108 (90 BASE) MCG/ACT inhaler INHALE ONE TO TWO PUFFS INTO LUNGS EVERY 6 HOURS AS NEEDED FOR WHEEZING 7 each 2   No current facility-administered medications for this encounter.     Allergies  Allergen Reactions  . Nsaids Shortness Of Breath    Social History   Social History  . Marital status: Married    Spouse name: N/A  . Number of children: N/A  . Years of education: N/A   Occupational History  . Not on file.   Social History Main Topics  . Smoking status: Never Smoker  . Smokeless tobacco: Never Used  . Alcohol use No  . Drug use: No  . Sexual activity: Not on file   Other Topics  Concern  . Not on file   Social History Narrative  . No narrative on file    Family History  Problem Relation Age of Onset  . Parkinsonism Mother   . Osteoarthritis Father     ROS- All systems are reviewed and negative except as per the HPI above  Physical Exam: Vitals:   05/14/16 0832  BP: 124/76  Pulse: 71  Weight: 263 lb (119.3 kg)  Height: 5\' 6"  (1.676 m)    GEN- The patient is well appearing, alert and oriented x 3 today.   Head- normocephalic, atraumatic Eyes-  Sclera clear, conjunctiva pink Ears- hearing intact Oropharynx- clear Neck- supple, no JVP Lymph- no cervical lymphadenopathy Lungs- Clear to ausculation bilaterally, normal work of breathing Heart-  regular rate and rhythm, no murmurs, rubs or gallops, PMI not laterally displaced GI- soft, NT, ND, + BS Extremities- no clubbing, cyanosis, or edema MS- no significant deformity or atrophy Skin- no rash or lesion Psych- euthymic mood, full affect Neuro- strength and sensation are intact  EKG- Undetermined rhythm, question  SR with difficult to see P  waves. Epic records reviewed  Assessment and Plan: 1.  Paroxsymal Atrial flutter Lopressor 25 mg bid added two weeks ago with return to sinus brady and pt has gained 22 lbs without significant change in eating pattern and is now off drug and is losing weight Think it is probably due to low heart rate and low  metabolism If continues to have frequent breakthrough in rhythm, may have  to consider change in antiarrythmic or consideration for ablation  F/u with Dr. Graciela HusbandsKlein as recall Afib clinic as needed   Elvina Sidleonna C. Matthew Folksarroll, ANP-C Afib Clinic [facility] [address] [place], [place] [address] [phone]

## 2016-07-15 ENCOUNTER — Encounter: Payer: Self-pay | Admitting: Internal Medicine

## 2016-07-15 ENCOUNTER — Ambulatory Visit (INDEPENDENT_AMBULATORY_CARE_PROVIDER_SITE_OTHER): Payer: No Typology Code available for payment source | Admitting: Internal Medicine

## 2016-07-15 VITALS — BP 120/64 | HR 85 | Ht 66.0 in | Wt 272.4 lb

## 2016-07-15 DIAGNOSIS — I4892 Unspecified atrial flutter: Secondary | ICD-10-CM | POA: Diagnosis not present

## 2016-07-15 DIAGNOSIS — I480 Paroxysmal atrial fibrillation: Secondary | ICD-10-CM | POA: Diagnosis not present

## 2016-07-15 MED ORDER — POTASSIUM CHLORIDE CRYS ER 20 MEQ PO TBCR
20.0000 meq | EXTENDED_RELEASE_TABLET | Freq: Every day | ORAL | 3 refills | Status: DC
Start: 2016-07-15 — End: 2017-08-09

## 2016-07-15 MED ORDER — FLECAINIDE ACETATE 150 MG PO TABS: 150.0000 mg | ORAL_TABLET | Freq: Two times a day (BID) | ORAL | 3 refills | Status: DC

## 2016-07-15 NOTE — Patient Instructions (Addendum)
Medication Instructions: - Your physician has recommended you make the following change in your medication:  1) Increase flecainide to 150 mg- take one tablet by mouth twice daily  Labwork: - none ordered  Procedures/Testing: - none ordered  Follow-Up: - Your physician recommends that you schedule a follow-up appointment in: 6 weeks with [name], NP for Dr. Graciela HusbandsKlein.  Any Additional Special Instructions Will Be Listed Below (If Applicable).     If you need a refill on your cardiac medications before your next appointment, please call your pharmacy.

## 2016-07-15 NOTE — Progress Notes (Signed)
Patient Care Team: Merlyn Albert, MD as PCP - General (Family Medicine)   HPI  Crystal Roth is a 66 y.o. female Seen in follow-up for paroxysmal atrial fibrillation for which she has taken apixoban and flecainide.  She has had multiple recurrences as outlined in the notes from the A. fib clinic 11/17  She has not felt well over the last few months with shortness of breath palpitations and fatigue; she's also had problems with edema which has been improved by the switching from furosemide--torsemide  CHADS-VASc score is 3 (Gender, hypertension, diabetes.)  Echocardiogram 10/17 normal LV function LA size 35  Records and Results Reviewed A. fib office notes  Sleep study negative about a year ago  Past Medical History:  Diagnosis Date  . Atrial fibrillation (HCC)    dx 5/13; Apixaban; echo 5/13: mild LVH, EF 60-65%, mod LAe, mild RAE, PASP 29  . Diabetes mellitus   . Fatty liver   . Hypertension   . kidney calculus   . Morbid obesity (HCC)   . PONV (postoperative nausea and vomiting)   . Reactive airway disease     Past Surgical History:  Procedure Laterality Date  . ABDOMINAL HYSTERECTOMY    . CARDIOVERSION  12/27/2011   Procedure: CARDIOVERSION;  Surgeon: Duke Salvia, MD;  Location: [facility] OR;  Service: Cardiovascular;  Laterality: N/A;  . CARDIOVERSION  01/17/2012   Procedure: CARDIOVERSION;  Surgeon: Duke Salvia, MD;  Location: [facility] OR;  Service: Cardiovascular;  Laterality: N/A;  . CARDIOVERSION N/A 03/15/2016   Procedure: CARDIOVERSION;  Surgeon: [name], MD;  Location: [facility] ENDOSCOPY;  Service: Cardiovascular;  Laterality: N/A;  . DILATION AND CURETTAGE OF UTERUS    . FOOT SURGERY    . TUBAL LIGATION      Current Outpatient Prescriptions  Medication Sig Dispense Refill  . acetaminophen (TYLENOL) 500 MG tablet Take 500 mg by mouth every 6 (six) hours as needed. Pain     . allopurinol (ZYLOPRIM) 300 MG tablet Take 300 mg by mouth daily.    .  benazepril (LOTENSIN) 20 MG tablet Take 20 mg by mouth daily.    . Cholecalciferol (VITAMIN D3) 5000 units TBDP Take by mouth.    . co-enzyme Q-10 30 MG capsule Take 30 mg by mouth 3 (three) times daily.    . Cyanocobalamin (B-12 COMPLIANCE INJECTION IJ) Inject as directed.    Marland Kitchen ELIQUIS 5 MG TABS tablet TAKE ONE TABLET BY MOUTH TWICE DAILY (CALL  AND  MAKE  A  ONE  YEAR  FOLLOW  UP  APPOINTMENT) 180 tablet 3  . flecainide (TAMBOCOR) 100 MG tablet TAKE ONE TABLET BY MOUTH TWICE DAILY (NEED  FOLLOW  UP) 60 tablet 2  . KLOR-CON M20 20 MEQ tablet TAKE ONE TABLET BY MOUTH ONCE DAILY **NEED OFFICE VISIT** (Patient taking differently: TAKE ONE TABLET BY MOUTH ONCE DAILY) 15 tablet 0  . MAGNESIUM PO Take 400 mg by mouth 2 (two) times daily.     . metFORMIN (GLUCOPHAGE) 500 MG tablet Take by mouth 2 (two) times daily with a meal. 1 to 2 tablets twice a day    . Multiple Vitamins-Minerals (OCUVITE EXTRA PO) Take by mouth.    Marland Kitchen PROVENTIL HFA 108 (90 BASE) MCG/ACT inhaler INHALE ONE TO TWO PUFFS INTO LUNGS EVERY 6 HOURS AS NEEDED FOR WHEEZING 7 each 2  . torsemide (DEMADEX) 10 MG tablet Take 10 mg by mouth daily. Take an extra 10 mg  if needed for fluid gain >3 lbs; alternate with the furosemide.  Do not take torsemide and furosemide together    . UNABLE TO FIND Take 2 tablets by mouth 2 (two) times daily at 10 AM and 5 PM. Mitochondrial NRG     . UNKNOWN TO PATIENT Take 1 Dose by mouth daily.     Marland Kitchen. VITAMIN B COMPLEX-C CAPS Take by mouth.    . furosemide (LASIX) 20 MG tablet Take 1 tablet (20 mg total) by mouth 2 (two) times daily. Take 20 mg twice a day for 5 days and then resume once daily (Patient taking differently: Take 20 mg by mouth 2 (two) times daily between meals as needed. Alternating with the torsemide.  Do not take furosemide and torsemide together) 30 tablet 3   No current facility-administered medications for this visit.     Allergies  Allergen Reactions  . Nsaids Shortness Of Breath  .  Metoprolol Other (See Comments)    Pt gained 22 lbs in 2 weeks. Depression. Pt states in made blood sugar irregular as well as caused bradycardia      Review of Systems negative except from HPI and PMH  Physical Exam BP 120/64   Pulse 85   Ht 5\' 6"  (1.676 m)   Wt 272 lb 6.4 oz (123.6 kg)   SpO2 96%   BMI 43.97 kg/m  Well developed and well nourished in no acute distress HENT normal E scleral and icterus clear Neck Supple JVP flat; carotids brisk and full Clear to ausculation  Regular rate and rhythm, 2/6 systolic right upper sternal border Soft with active bowel sounds No clubbing cyanosis  Edema Alert and oriented, grossly normal motor and sensory function Skin Warm and Dry  ECG demonstrates sinus rhythm at 80 Intervals 16/10/41 excellent axis LVI PVCs left bundle superior axis pattern of pentageminy  Assessment and  Plan Atrial flutter/fibrillation  Flecainide therapy causing fibrillation--flutter  Hypertension   Metoprolol intolerance   PVCs  The PVCs may be contributing to some of her symptoms. There has been no clear impact on her LV function based on her most recent echo  Her atrial fibrillation is a problem. We discussed alternatives including up titration of her flecainide, the use of propafenone is use being consulted concern giving her poor tolerance of metoprolol amiodarone as her QT is too long for dofetilide and/or catheter ablation.   For now we will increase her flecainide. In the event that this does not control her atrial fibrillation, we will try propafenone at 325 twice a day.  Might need amiodarone as an intermediate step towards ablation  in this regard is important to note that her sleep study was normal    Current medicines are reviewed at length with the patient today .  The patient does not have concerns regarding medicines. Complex decision making

## 2016-08-26 ENCOUNTER — Other Ambulatory Visit: Payer: Self-pay | Admitting: Family Medicine

## 2016-08-26 ENCOUNTER — Other Ambulatory Visit (HOSPITAL_COMMUNITY): Payer: Self-pay | Admitting: Nurse Practitioner

## 2016-08-27 ENCOUNTER — Ambulatory Visit (HOSPITAL_COMMUNITY)
Admission: RE | Admit: 2016-08-27 | Discharge: 2016-08-27 | Disposition: A | Discharge status: Home / Self Care | Payer: No Typology Code available for payment source | Source: Ambulatory Visit | Attending: Nurse Practitioner | Admitting: Nurse Practitioner

## 2016-08-27 ENCOUNTER — Encounter (HOSPITAL_COMMUNITY): Payer: Self-pay | Admitting: Nurse Practitioner

## 2016-08-27 VITALS — BP 146/82 | HR 71 | Ht 66.0 in | Wt 282.0 lb

## 2016-08-27 DIAGNOSIS — Z79899 Other long term (current) drug therapy: Secondary | ICD-10-CM | POA: Diagnosis not present

## 2016-08-27 DIAGNOSIS — Z5189 Encounter for other specified aftercare: Secondary | ICD-10-CM | POA: Diagnosis present

## 2016-08-27 DIAGNOSIS — Z9889 Other specified postprocedural states: Secondary | ICD-10-CM | POA: Insufficient documentation

## 2016-08-27 DIAGNOSIS — I4892 Unspecified atrial flutter: Secondary | ICD-10-CM | POA: Diagnosis not present

## 2016-08-27 DIAGNOSIS — E119 Type 2 diabetes mellitus without complications: Secondary | ICD-10-CM | POA: Diagnosis not present

## 2016-08-27 DIAGNOSIS — Z7984 Long term (current) use of oral hypoglycemic drugs: Secondary | ICD-10-CM | POA: Insufficient documentation

## 2016-08-27 DIAGNOSIS — I480 Paroxysmal atrial fibrillation: Secondary | ICD-10-CM

## 2016-08-27 DIAGNOSIS — Z9851 Tubal ligation status: Secondary | ICD-10-CM | POA: Diagnosis not present

## 2016-08-27 DIAGNOSIS — Z90710 Acquired absence of both cervix and uterus: Secondary | ICD-10-CM | POA: Insufficient documentation

## 2016-08-27 DIAGNOSIS — R609 Edema, unspecified: Secondary | ICD-10-CM | POA: Diagnosis not present

## 2016-08-27 NOTE — Progress Notes (Signed)
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 She had h/o morbid obesity but has lost 70 lbs over the last year. CHADS-VASC score is 3(female,HTN,DM).  She saw Dr. Graciela Husbands 9/7 and was in persistent afib and was set up for  successful cardioversion. She and her husband then went on vacation for the next  week . During this vacation she had swelling, they ate on the road and consumed more salt than usual. Yesterday, she noted her heart fluttering and asked to be seen.   Ekg is similar to EKG prior to cardioversion which Dr. Graciela Husbands interpreted as aflutter. HR is the upper 80's and pt says she has noted 90's at home. Her normal heart rate is in the 50's. Her fluid status is back to normal today and in fact her weight is down one pound from prior visit with Dr Graciela Husbands. She has never been on any other antiarrythmic, left atrial size is 58 mm.  She presented in SR on day of cardioversion and it was cancelled. She then saw Gypsy Balsam, NP, two weeks ago, at which time she had returned to atrial flutter. She was placed on metoprolol tartrate 25 mg bid and after a few days returned to Sinus brady. Her heart rate has been in the upper 40's/low 50's. She has gained 22 lbs over the last two weeks, feels somewhat bloated but no shortness of breath or significant LLE. No PND/orthopnea. Pt sates that she has a very low metabolism,and although she states that she has not eaten any different,  a very low heart rate as well as not  exercising for the last 5-6 weeks has probably contributed to weight gain. Echo updated and has normal EF, left atrium showed improvement, from 58 mm to 40 mm.Pt states before BB, heart rate usually ran in the 70's.  She returns 11/7 for f/u in afib clinic. On last visit, she was back in rhythm but had gained 22 lbs which she  contributed to the BB. She has since stopped drug and has lost 9 lbs over the last 2 weeks. She continues on flecainide 100 mg bid and Eliquis 5 mg bid.  F/u in afib clinic 2/20. She saw Dr. Graciela Husbands in January where pt described increase in afib burden. Flecainide was increased to 150 mg bid. She has felt that her afib burden has diminished and she is happy for the time being with increased flecainide. She feels that her trigger is fluid retention and feels that her fluid status is improved with change from furosemide to torsemide.  Today, she denies symptoms of   chest pain, shortness of breath, orthopnea, PND  dizziness, presyncope, syncope, or neurologic sequela.Positive for palpitations, edema which is improved. Mild LEE.  Past Medical History:  Diagnosis Date  . Atrial fibrillation (HCC)    dx 5/13; Apixaban; echo 5/13: mild LVH, EF 60-65%, mod LAe, mild RAE, PASP 29  . Diabetes mellitus   . Fatty liver   . Hypertension   . kidney calculus   . Morbid obesity (HCC)   . PONV (postoperative nausea and vomiting)   . Reactive airway disease    Past Surgical History:  Procedure Laterality Date  . ABDOMINAL HYSTERECTOMY    . CARDIOVERSION  12/27/2011   Procedure: CARDIOVERSION;  Surgeon: Duke Salvia, MD;  Location: [facility] OR;  Service: Cardiovascular;  Laterality: N/A;  . CARDIOVERSION  01/17/2012   Procedure: CARDIOVERSION;  Surgeon: Duke Salvia, MD;  Location: [facility] OR;  Service: Cardiovascular;  Laterality: N/A;  . CARDIOVERSION N/A 03/15/2016   Procedure: CARDIOVERSION;  Surgeon: [name], MD;  Location[facility] ENDOSCOPY;  Service: Cardiovascular;  Laterality: N/A;  . DILATION AND CURETTAGE OF UTERUS    . FOOT SURGERY    . TUBAL LIGATION      Current Outpatient Prescriptions  Medication Sig Dispense Refill  . acetaminophen (TYLENOL) 500 MG tablet Take 500 mg by mouth every 6 (six) hours as needed. Pain     . allopurinol (ZYLOPRIM) 300 MG tablet Take 300 mg by mouth daily.    .  benazepril (LOTENSIN) 20 MG tablet Take 20 mg by mouth daily.    . Cholecalciferol (VITAMIN D3) 5000 units TBDP Take by mouth.    Everlene Balls 5 MG TABS tablet TAKE ONE TABLET BY MOUTH TWICE DAILY (CALL  AND  MAKE  A  ONE  YEAR  FOLLOW  UP  APPOINTMENT) 180 tablet 3  . flecainide (TAMBOCOR) 150 MG tablet Take 1 tablet (150 mg total) by mouth 2 (two) times daily. 180 tablet 3  . MAGNESIUM PO Take 400 mg by mouth 2 (two) times daily.     . metFORMIN (GLUCOPHAGE) 500 MG tablet Take by mouth 2 (two) times daily with a meal. 1 to 2 tablets twice a day    . Multiple Vitamins-Minerals (OCUVITE EXTRA PO) Take by mouth.    . potassium chloride SA (KLOR-CON M20) 20 MEQ tablet Take 1 tablet (20 mEq total) by mouth daily. 90 tablet 3  . PROVENTIL HFA 108 (90 BASE) MCG/ACT inhaler INHALE ONE TO TWO PUFFS INTO LUNGS EVERY 6 HOURS AS NEEDED FOR WHEEZING 7 each 2  . torsemide (DEMADEX) 10 MG tablet Take 10 mg by mouth daily. Take an extra 10 mg if needed for fluid gain >3 lbs; alternate with the furosemide.  Do not take torsemide and furosemide together    . UNABLE TO FIND Take 2 tablets by mouth 2 (two) times daily at 10 AM and 5 PM. Mitochondrial NRG     . UNKNOWN TO PATIENT Take 1 Dose by mouth daily.     Marland Kitchen co-enzyme Q-10 30 MG capsule Take 30 mg by mouth 3 (three) times daily.    . Cyanocobalamin (B-12 COMPLIANCE INJECTION IJ) Inject as directed.    . furosemide (LASIX) 20 MG tablet Take 1 tablet (20 mg total) by mouth 2 (two) times daily. Take 20 mg twice a day for 5 days and then resume once daily (Patient taking differently: Take 20 mg by mouth as needed. Do not take furosemide and torsemide together) 30 tablet 3  . VITAMIN B COMPLEX-C CAPS Take by mouth.     No current facility-administered medications for this encounter.     Allergies  Allergen Reactions  . Nsaids Shortness Of Breath  . Metoprolol Other (See Comments)    Pt gained 22 lbs in 2 weeks. Depression. Pt states in made blood sugar irregular  as well as caused bradycardia    Social History   Social History  . Marital status: Married    Spouse name: N/A  . Number of children: N/A  . Years of education: N/A   Occupational History  . Not on file.   Social History Main Topics  . Smoking status: Never Smoker  . Smokeless tobacco: Never Used  .  Alcohol use No  . Drug use: No  . Sexual activity: Not on file   Other Topics Concern  . Not on file   Social History Narrative  . No narrative on file    Family History  Problem Relation Age of Onset  . Parkinsonism Mother   . Osteoarthritis Father     ROS- All systems are reviewed and negative except as per the HPI above  Physical Exam: Vitals:   08/27/16 0848  BP: (!) 146/82  Pulse: 71  Weight: 282 lb (127.9 kg)  Height: 5\' 6"  (1.676 m)    GEN- The patient is well appearing, alert and oriented x 3 today.   Head- normocephalic, atraumatic Eyes-  Sclera clear, conjunctiva pink Ears- hearing intact Oropharynx- clear Neck- supple, no JVP Lymph- no cervical lymphadenopathy Lungs- Clear to ausculation bilaterally, normal work of breathing Heart- regular rate and rhythm, no murmurs, rubs or gallops, PMI not laterally displaced GI- soft, NT, ND, + BS Extremities- no clubbing, cyanosis, or edema MS- no significant deformity or atrophy Skin- no rash or lesion Psych- euthymic mood, full affect Neuro- strength and sensation are intact  EKG- Undetermined rhythm, question  SR with difficult to see P  Waves. ? Pr int 200 ms, qrs int 102 ms, qtc 482 ms Epic records reviewed  Assessment and Plan: 1.  Paroxsymal Atrial flutter afib burden has diminished with increase of flecainide to 150 mg bid  Flecainde level Continue eliquis 5 mg bid  2. Edema improved with change from furosemide to torsemide Continue to avoid salt Pt tries to minimize eating out  3. DM Encouraged good control to minimize afib burden  4. Obesity Pt is trying to diet  F/u in afib clinic  in 3 months  Lupita LeashDonna C. Matthew Folksarroll, ANP-C [facility]ib Clinic [facility] [address] [place], [place] [address] [phone]

## 2016-08-28 LAB — FLECAINIDE LEVEL: Flecainide: 0.49 ug/mL (ref 0.20–1.00)

## 2016-11-27 ENCOUNTER — Encounter (HOSPITAL_COMMUNITY): Payer: Self-pay | Admitting: Nurse Practitioner

## 2016-11-27 ENCOUNTER — Ambulatory Visit (HOSPITAL_COMMUNITY)
Admission: RE | Admit: 2016-11-27 | Discharge: 2016-11-27 | Disposition: A | Discharge status: Home / Self Care | Payer: No Typology Code available for payment source | Source: Ambulatory Visit | Attending: Nurse Practitioner | Admitting: Nurse Practitioner

## 2016-11-27 VITALS — BP 142/76 | HR 80 | Ht 66.0 in | Wt 301.8 lb

## 2016-11-27 DIAGNOSIS — I4892 Unspecified atrial flutter: Secondary | ICD-10-CM | POA: Insufficient documentation

## 2016-11-27 DIAGNOSIS — Z7901 Long term (current) use of anticoagulants: Secondary | ICD-10-CM | POA: Insufficient documentation

## 2016-11-27 DIAGNOSIS — Z6841 Body Mass Index (BMI) 40.0 and over, adult: Secondary | ICD-10-CM | POA: Insufficient documentation

## 2016-11-27 DIAGNOSIS — I10 Essential (primary) hypertension: Secondary | ICD-10-CM | POA: Diagnosis not present

## 2016-11-27 DIAGNOSIS — I480 Paroxysmal atrial fibrillation: Secondary | ICD-10-CM | POA: Diagnosis not present

## 2016-11-27 DIAGNOSIS — E119 Type 2 diabetes mellitus without complications: Secondary | ICD-10-CM | POA: Diagnosis not present

## 2016-11-27 NOTE — Progress Notes (Signed)
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 She had h/o morbid obesity but has lost 70 lbs over the last year. CHADS-VASC score is 3(female,HTN,DM).  She saw Dr. Graciela Husbands 9/7 and was in persistent afib and was set up for  successful cardioversion. She and her husband then went on vacation for the next  week . During this vacation she had swelling, they ate on the road and consumed more salt than usual. Yesterday, she noted her heart fluttering and asked to be seen.   Ekg is similar to EKG prior to cardioversion which Dr. Graciela Husbands interpreted as aflutter. HR is the upper 80's and pt says she has noted 90's at home. Her normal heart rate is in the 50's. Her fluid status is back to normal today and in fact her weight is down one pound from prior visit with Dr Graciela Husbands. She has never been on any other antiarrythmic, left atrial size is 58 mm.  She presented in SR on day of cardioversion and it was cancelled. She then saw Gypsy Balsam, NP, two weeks ago, at which time she had returned to atrial flutter. She was placed on metoprolol tartrate 25 mg bid and after a few days returned to Sinus brady. Her heart rate has been in the upper 40's/low 50's. She has gained 22 lbs over the last two weeks, feels somewhat bloated but no shortness of breath or significant LLE. No PND/orthopnea. Pt sates that she has a very low metabolism,and although she states that she has not eaten any different,  a very low heart rate as well as not  exercising for the last 5-6 weeks has probably contributed to weight gain. Echo updated and has normal EF, left atrium showed improvement, from 58 mm to 40 mm.Pt states before BB, heart rate usually ran in the 70's.  She returns 11/7 for f/u in afib clinic. On last visit, she was back in rhythm but had gained 22 lbs which  she contributed to the BB. She has since stopped drug and has lost 9 lbs over the last 2 weeks. She continues on flecainide 100 mg bid and Eliquis 5 mg bid.  F/u in afib clinic 2/20. She saw Dr. Graciela Husbands in January where pt described increase in afib burden. Flecainide was increased to 150 mg bid. She has felt that her afib burden has diminished and she is happy for the time being with increased flecainide. She feels that her trigger is fluid retention and feels that her fluid status is improved with change from furosemide to torsemide.  AFib clinic 5/23, pt is in SR and has had very little afib burden since increase of flecainide to 150 mg bid. EKG intervals normal. Struggles with weight and has gained another 20 lbs. Continues on eliquis without bleeding issues.  Today, she denies symptoms of   chest pain, shortness of breath, orthopnea, PND  dizziness, presyncope, syncope, or neurologic sequela.Positive for palpitations, edema which is improved. Mild LEE.  Past Medical History:  Diagnosis Date  . Atrial fibrillation (HCC)    dx 5/13; Apixaban; echo 5/13: mild LVH, EF 60-65%, mod LAe, mild RAE, PASP 29  . Diabetes mellitus   . Fatty liver   . Hypertension   . kidney calculus   . Morbid obesity (HCC)   .  PONV (postoperative nausea and vomiting)   . Reactive airway disease    Past Surgical History:  Procedure Laterality Date  . ABDOMINAL HYSTERECTOMY    . CARDIOVERSION  12/27/2011   Procedure: CARDIOVERSION;  Surgeon: Duke SalviaSteven C Klein, MD;  [facility];  Service: Cardiovascular;  Laterality: N/A;  . CARDIOVERSION  01/17/2012   Procedure: CARDIOVERSION;  Surgeon: Duke SalviaSteven C Klein, MD;  [facility];  Service: Cardiovascular;  Laterality: N/A;  . CARDIOVERSION N/A 03/15/2016   Procedure: CARDIOVERSION;  Surgeon: Vesta MixerPhilip J Nahser, MD;  [facility];  Service: Cardiovascular;  Laterality: N/A;  . DILATION AND CURETTAGE OF UTERUS    . FOOT SURGERY    . TUBAL LIGATION      Current  Outpatient Prescriptions  Medication Sig Dispense Refill  . acetaminophen (TYLENOL) 500 MG tablet Take 500 mg by mouth every 6 (six) hours as needed. Pain     . allopurinol (ZYLOPRIM) 300 MG tablet Take 300 mg by mouth daily.    . benazepril (LOTENSIN) 20 MG tablet Take 20 mg by mouth daily.    . Cholecalciferol (VITAMIN D3) 5000 units TBDP Take by mouth.    . co-enzyme Q-10 30 MG capsule Take 30 mg by mouth 3 (three) times daily.    . Cyanocobalamin (B-12 COMPLIANCE INJECTION IJ) Inject as directed.    Marland Kitchen. ELIQUIS 5 MG TABS tablet TAKE ONE TABLET BY MOUTH TWICE DAILY (CALL  AND  MAKE  A  ONE  YEAR  FOLLOW  UP  APPOINTMENT) 180 tablet 3  . flecainide (TAMBOCOR) 150 MG tablet Take 1 tablet (150 mg total) by mouth 2 (two) times daily. 180 tablet 3  . furosemide (LASIX) 20 MG tablet TAKE ONE TABLET BY MOUTH ONCE DAILY (Patient taking differently: TAKE ONE TABLET BY MOUTH ONCE DAILY as needed) 90 tablet 2  . glipiZIDE (GLUCOTROL) 5 MG tablet Take by mouth 2 (two) times daily before a meal.    . MAGNESIUM PO Take 400 mg by mouth 2 (two) times daily.     . metFORMIN (GLUCOPHAGE) 500 MG tablet Take by mouth 2 (two) times daily with a meal. 1 to 2 tablets twice a day    . Multiple Vitamins-Minerals (OCUVITE EXTRA PO) Take by mouth.    . potassium chloride SA (KLOR-CON M20) 20 MEQ tablet Take 1 tablet (20 mEq total) by mouth daily. 90 tablet 3  . PROVENTIL HFA 108 (90 BASE) MCG/ACT inhaler INHALE ONE TO TWO PUFFS INTO LUNGS EVERY 6 HOURS AS NEEDED FOR WHEEZING 7 each 2  . torsemide (DEMADEX) 10 MG tablet Take 10 mg by mouth daily. Take an extra 10 mg if needed for fluid gain >3 lbs; alternate with the furosemide.  Do not take torsemide and furosemide together    . UNKNOWN TO PATIENT Take 1 Dose by mouth daily.     Marland Kitchen. VITAMIN B COMPLEX-C CAPS Take by mouth.    Marland Kitchen. UNABLE TO FIND Take 2 tablets by mouth 2 (two) times daily at 10 AM and 5 PM. Mitochondrial NRG      No current facility-administered medications  for this encounter.     Allergies  Allergen Reactions  . Nsaids Shortness Of Breath  . Metoprolol Other (See Comments)    Pt gained 22 lbs in 2 weeks. Depression. Pt states in made blood sugar irregular as well as caused bradycardia    Social History   Social History  . Marital status: Married    Spouse name: N/A  .  Number of children: N/A  . Years of education: N/A   Occupational History  . Not on file.   Social History Main Topics  . Smoking status: Never Smoker  . Smokeless tobacco: Never Used  . Alcohol use No  . Drug use: No  . Sexual activity: Not on file   Other Topics Concern  . Not on file   Social History Narrative  . No narrative on file    Family History  Problem Relation Age of Onset  . Parkinsonism Mother   . Osteoarthritis Father     ROS- All systems are reviewed and negative except as per the HPI above  Physical Exam: Vitals:   11/27/16 0838  BP: (!) 142/76  Pulse: 80  Weight: (!) 301 lb 12.8 oz (136.9 kg)  Height: 5\' 6"  (1.676 m)    GEN- The patient is well appearing, alert and oriented x 3 today.   Head- normocephalic, atraumatic Eyes-  Sclera clear, conjunctiva pink Ears- hearing intact Oropharynx- clear Neck- supple, no JVP Lymph- no cervical lymphadenopathy Lungs- Clear to ausculation bilaterally, normal work of breathing Heart- regular rate and rhythm, no murmurs, rubs or gallops, PMI not laterally displaced GI- soft, NT, ND, + BS Extremities- no clubbing, cyanosis, or edema MS- no significant deformity or atrophy Skin- no rash or lesion Psych- euthymic mood, full affect Neuro- strength and sensation are intact  EKG- NSR at 80 bpm,Pr int 168 ms, qrs int 100 ms, qtc 491 ms Epic records reviewed  Assessment and Plan: 1.  Paroxsymal Atrial flutter Continues to do well with flecainide at 150 mg bid  Continue eliquis 5 mg bid  2. Edema improved with change from furosemide to torsemide Continue to avoid salt Pt tries to  minimize eating out  3. DM Encouraged good control to minimize afib burden  4.  Morbid Obesity Pt is trying to diet, struggles with weight loss  F/u with Dr. Graciela Husbands in September  Lupita Leash C. Matthew Folks Afib Clinic [facility] [address] [place], [place] [address] [phone]

## 2017-04-10 ENCOUNTER — Other Ambulatory Visit: Payer: Self-pay | Admitting: Internal Medicine

## 2017-05-05 ENCOUNTER — Telehealth (HOSPITAL_COMMUNITY): Payer: Self-pay | Admitting: *Deleted

## 2017-05-05 MED ORDER — DILTIAZEM HCL 30 MG PO TABS: ORAL_TABLET | ORAL | 1 refills | Status: DC

## 2017-05-05 NOTE — Telephone Encounter (Signed)
Patient called in stating she has been in afib since Thursday while out of town. HR is running between 70 up to 140s with exertion. Weight was up over weekend that she took torsemide for with some results.  Discussed with [name] NP and will call in diltiazem 30mg  as needed every 4 hours for HR over 100. Appt made for tomorrow if not back in NSR. Pt in agreement and when to go to ER was reviewed.

## 2017-05-06 ENCOUNTER — Encounter (HOSPITAL_COMMUNITY): Payer: Self-pay | Admitting: Nurse Practitioner

## 2017-05-06 ENCOUNTER — Ambulatory Visit (HOSPITAL_COMMUNITY)
Admission: RE | Admit: 2017-05-06 | Discharge: 2017-05-06 | Disposition: A | Discharge status: Home / Self Care | Payer: No Typology Code available for payment source | Source: Ambulatory Visit | Attending: Nurse Practitioner | Admitting: Nurse Practitioner

## 2017-05-06 ENCOUNTER — Other Ambulatory Visit (HOSPITAL_COMMUNITY): Payer: Self-pay | Admitting: *Deleted

## 2017-05-06 VITALS — BP 156/82 | HR 109 | Ht 66.0 in | Wt 310.2 lb

## 2017-05-06 DIAGNOSIS — Z7902 Long term (current) use of antithrombotics/antiplatelets: Secondary | ICD-10-CM | POA: Insufficient documentation

## 2017-05-06 DIAGNOSIS — K760 Fatty (change of) liver, not elsewhere classified: Secondary | ICD-10-CM | POA: Insufficient documentation

## 2017-05-06 DIAGNOSIS — Z820 Family history of epilepsy and other diseases of the nervous system: Secondary | ICD-10-CM | POA: Diagnosis not present

## 2017-05-06 DIAGNOSIS — E119 Type 2 diabetes mellitus without complications: Secondary | ICD-10-CM | POA: Insufficient documentation

## 2017-05-06 DIAGNOSIS — Z6841 Body Mass Index (BMI) 40.0 and over, adult: Secondary | ICD-10-CM | POA: Insufficient documentation

## 2017-05-06 DIAGNOSIS — Z886 Allergy status to analgesic agent status: Secondary | ICD-10-CM | POA: Diagnosis not present

## 2017-05-06 DIAGNOSIS — Z7984 Long term (current) use of oral hypoglycemic drugs: Secondary | ICD-10-CM | POA: Diagnosis not present

## 2017-05-06 DIAGNOSIS — Z79899 Other long term (current) drug therapy: Secondary | ICD-10-CM | POA: Diagnosis not present

## 2017-05-06 DIAGNOSIS — Z8261 Family history of arthritis: Secondary | ICD-10-CM | POA: Diagnosis not present

## 2017-05-06 DIAGNOSIS — Z9889 Other specified postprocedural states: Secondary | ICD-10-CM | POA: Insufficient documentation

## 2017-05-06 DIAGNOSIS — I10 Essential (primary) hypertension: Secondary | ICD-10-CM | POA: Diagnosis not present

## 2017-05-06 DIAGNOSIS — Z888 Allergy status to other drugs, medicaments and biological substances status: Secondary | ICD-10-CM | POA: Diagnosis not present

## 2017-05-06 DIAGNOSIS — Z87442 Personal history of urinary calculi: Secondary | ICD-10-CM | POA: Diagnosis not present

## 2017-05-06 DIAGNOSIS — J45909 Unspecified asthma, uncomplicated: Secondary | ICD-10-CM | POA: Diagnosis not present

## 2017-05-06 DIAGNOSIS — I480 Paroxysmal atrial fibrillation: Secondary | ICD-10-CM

## 2017-05-06 DIAGNOSIS — Z90710 Acquired absence of both cervix and uterus: Secondary | ICD-10-CM | POA: Insufficient documentation

## 2017-05-06 LAB — CBC
HCT: 43.7 % (ref 36.0–46.0)
Hemoglobin: 14 g/dL (ref 12.0–15.0)
MCH: 30.3 pg (ref 26.0–34.0)
MCHC: 32 g/dL (ref 30.0–36.0)
MCV: 94.6 fL (ref 78.0–100.0)
PLATELETS: 236 10*3/uL (ref 150–400)
RBC: 4.62 MIL/uL (ref 3.87–5.11)
RDW: 14.1 % (ref 11.5–15.5)
WBC: 5.8 10*3/uL (ref 4.0–10.5)

## 2017-05-06 LAB — BASIC METABOLIC PANEL
ANION GAP: 9 (ref 5–15)
BUN: 11 mg/dL (ref 6–20)
CALCIUM: 8.9 mg/dL (ref 8.9–10.3)
CO2: 31 mmol/L (ref 22–32)
CREATININE: 0.81 mg/dL (ref 0.44–1.00)
Chloride: 100 mmol/L — ABNORMAL LOW (ref 101–111)
GLUCOSE: 161 mg/dL — AB (ref 65–99)
Potassium: 3.8 mmol/L (ref 3.5–5.1)
Sodium: 140 mmol/L (ref 135–145)

## 2017-05-06 NOTE — Patient Instructions (Addendum)
Cardioversion scheduled for Wednesday, October 31st   - Arrive at the [facility] and go to admitting at 9:30AM  -Do not eat or drink anything after midnight the night prior to your procedure.  - Take all your medication with a sip of water prior to arrival.  - You will not be able to drive home after your procedure.  Follow up with Dr. Graciela HusbandsKlein in 1 month -- his scheduler will be in touch with you.

## 2017-05-06 NOTE — Progress Notes (Signed)
 Primary Care Physician: Luking, William S, MD Referring Physician: Dr. Klein   Crystal Roth is a 66 y.o. female with a h/o paroxymal afib that is in the afib clinic for onset of persistent afib 10/30. She saw Dr. Klein in January of the year at which time he increased her flecainide to 150 mg bid as he did not have any other options to manage her afib.Her Qtc is too long for Tikosyn. On the young side for amiodarone.Not a good candidate for ablation. Her weight is up 9 lbs. Has had some recent family stressors lately. She has not missed any doses of eliquis.    Today, she denies symptoms of palpitations, chest pain, shortness of breath, orthopnea, PND, lower extremity edema, dizziness, presyncope, syncope, or neurologic sequela. The patient is tolerating medications without difficulties and is otherwise without complaint today.   Past Medical History:  Diagnosis Date  . Atrial fibrillation (HCC)    dx 5/13; Apixaban; echo 5/13: mild LVH, EF 60-65%, mod LAe, mild RAE, PASP 29  . Diabetes mellitus   . Fatty liver   . Hypertension   . kidney calculus   . Morbid obesity (HCC)   . PONV (postoperative nausea and vomiting)   . Reactive airway disease    Past Surgical History:  Procedure Laterality Date  . ABDOMINAL HYSTERECTOMY    . CARDIOVERSION  12/27/2011   Procedure: CARDIOVERSION;  Surgeon: Steven C Klein, MD;  Location: [facility] OR;  Service: Cardiovascular;  Laterality: N/A;  . CARDIOVERSION  01/17/2012   Procedure: CARDIOVERSION;  Surgeon: Steven C Klein, MD;  Location: [facility] OR;  Service: Cardiovascular;  Laterality: N/A;  . CARDIOVERSION N/A 03/15/2016   Procedure: CARDIOVERSION;  Surgeon: Philip J Nahser, MD;  Location: [facility] ENDOSCOPY;  Service: Cardiovascular;  Laterality: N/A;  . DILATION AND CURETTAGE OF UTERUS    . FOOT SURGERY    . TUBAL LIGATION      Current Outpatient Prescriptions  Medication Sig Dispense Refill  . acetaminophen (TYLENOL) 500 MG tablet Take 500 mg by mouth  every 6 (six) hours as needed. Pain     . allopurinol (ZYLOPRIM) 300 MG tablet Take 300 mg by mouth daily.    . benazepril (LOTENSIN) 20 MG tablet Take 20 mg by mouth daily.    . Cholecalciferol (VITAMIN D3) 5000 units TBDP Take by mouth.    . co-enzyme Q-10 30 MG capsule Take 30 mg by mouth 3 (three) times daily.    . Cyanocobalamin (B-12 COMPLIANCE INJECTION IJ) Inject as directed.    . diltiazem (CARDIZEM) 30 MG tablet Take 1 tablet every 4 hours AS NEEDED for afib rapid heart rate over 100 45 tablet 1  . ELIQUIS 5 MG TABS tablet TAKE ONE TABLET BY MOUTH TWICE DAILY **CALL  OFFICE  AND  MAKE  1  YEAR  FOLLOW  UP  APPOINTMENT** 60 tablet 0  . flecainide (TAMBOCOR) 150 MG tablet Take 1 tablet (150 mg total) by mouth 2 (two) times daily. 180 tablet 3  . furosemide (LASIX) 20 MG tablet TAKE ONE TABLET BY MOUTH ONCE DAILY (Patient taking differently: TAKE ONE TABLET BY MOUTH ONCE DAILY as needed) 90 tablet 2  . glipiZIDE (GLUCOTROL) 5 MG tablet Take by mouth 2 (two) times daily before a meal.    . MAGNESIUM PO Take 400 mg by mouth 2 (two) times daily.     . metFORMIN (GLUCOPHAGE) 500 MG tablet Take by mouth 2 (two) times daily with a meal. 1 to 2   tablets twice a day    . Multiple Vitamins-Minerals (OCUVITE EXTRA PO) Take by mouth.    . potassium chloride SA (KLOR-CON M20) 20 MEQ tablet Take 1 tablet (20 mEq total) by mouth daily. 90 tablet 3  . PROVENTIL HFA 108 (90 BASE) MCG/ACT inhaler INHALE ONE TO TWO PUFFS INTO LUNGS EVERY 6 HOURS AS NEEDED FOR WHEEZING 7 each 2  . torsemide (DEMADEX) 10 MG tablet Take 10 mg by mouth daily. Take an extra 10 mg if needed for fluid gain >3 lbs; alternate with the furosemide.  Do not take torsemide and furosemide together    . VITAMIN B COMPLEX-C CAPS Take by mouth.    . UNABLE TO FIND Take 2 tablets by mouth 2 (two) times daily at 10 AM and 5 PM. Mitochondrial NRG     . UNKNOWN TO PATIENT Take 1 Dose by mouth daily.      No current facility-administered  medications for this encounter.     Allergies  Allergen Reactions  . Nsaids Shortness Of Breath  . Metoprolol Other (See Comments)    Pt gained 22 lbs in 2 weeks. Depression. Pt states in made blood sugar irregular as well as caused bradycardia    Social History   Social History  . Marital status: Married    Spouse name: N/A  . Number of children: N/A  . Years of education: N/A   Occupational History  . Not on file.   Social History Main Topics  . Smoking status: Never Smoker  . Smokeless tobacco: Never Used  . Alcohol use No  . Drug use: No  . Sexual activity: Not on file   Other Topics Concern  . Not on file   Social History Narrative  . No narrative on file    Family History  Problem Relation Age of Onset  . Parkinsonism Mother   . Osteoarthritis Father     ROS- All systems are reviewed and negative except as per the HPI above  Physical Exam: Vitals:   05/06/17 1400  BP: (!) 156/82  Pulse: (!) 109  SpO2: 94%  Weight: (!) 310 lb 3.2 oz (140.7 kg)  Height: 5' 6" (1.676 m)   Wt Readings from Last 3 Encounters:  05/06/17 (!) 310 lb 3.2 oz (140.7 kg)  11/27/16 (!) 301 lb 12.8 oz (136.9 kg)  08/27/16 282 lb (127.9 kg)    Labs: Lab Results  Component Value Date   NA 140 04/01/2016   K 3.6 04/01/2016   CL 103 04/01/2016   CO2 28 04/01/2016   GLUCOSE 108 (H) 04/01/2016   BUN 14 04/01/2016   CREATININE 0.66 04/01/2016   CALCIUM 9.3 04/01/2016   MG 1.7 04/01/2016   No results found for: INR Lab Results  Component Value Date   CHOL 216 (H) 03/04/2013   HDL 64 03/04/2013   LDLCALC 124 (H) 03/04/2013   TRIG 142 03/04/2013     GEN- The patient is well appearing, alert and oriented x 3 today.   Head- normocephalic, atraumatic Eyes-  Sclera clear, conjunctiva pink Ears- hearing intact Oropharynx- clear Neck- supple, no JVP Lymph- no cervical lymphadenopathy Lungs- Clear to ausculation bilaterally, normal work of breathing Heart-irregular rate  and rhythm, no murmurs, rubs or gallops, PMI not laterally displaced GI- soft, NT, ND, + BS Extremities- no clubbing, cyanosis, or edema MS- no significant deformity or atrophy Skin- no rash or lesion Psych- euthymic mood, full affect Neuro- strength and sensation are intact  EKG-afib at 109   bpm, qrs int 112 ms, qtc 487 ms Epic records reviewed    Assessment and Plan: 1. Paroxysmal afib Afib quiet since earlier in year when flecainide was increased to 150 mg bid However, recent breakthrough on flecainide and will set up for cardioversion tommorrow No missed doses of eliquis Unfortunately do not have any better options for pt to keep in SR  Work note given to return to work Thursday at noon Bmet/mag  F/u after cardioversion with Dr. Klein in the next month  Maxwell Lemen C. Usama Harkless, ANP-C [facility] [address] [place], [place] [address] [phone]    

## 2017-05-07 ENCOUNTER — Encounter (HOSPITAL_COMMUNITY)
Admission: RE | Disposition: A | Discharge status: Home / Self Care | Payer: Self-pay | Source: Ambulatory Visit | Attending: Cardiovascular Disease

## 2017-05-07 ENCOUNTER — Ambulatory Visit (HOSPITAL_COMMUNITY): Payer: No Typology Code available for payment source | Admitting: Anesthesiology

## 2017-05-07 ENCOUNTER — Encounter (HOSPITAL_COMMUNITY): Payer: Self-pay | Admitting: Cardiovascular Disease

## 2017-05-07 ENCOUNTER — Ambulatory Visit (HOSPITAL_COMMUNITY)
Admission: RE | Admit: 2017-05-07 | Discharge: 2017-05-07 | Disposition: A | Discharge status: Home / Self Care | Payer: No Typology Code available for payment source | Source: Ambulatory Visit | Attending: Cardiovascular Disease | Admitting: Cardiovascular Disease

## 2017-05-07 DIAGNOSIS — Z791 Long term (current) use of non-steroidal anti-inflammatories (NSAID): Secondary | ICD-10-CM | POA: Insufficient documentation

## 2017-05-07 DIAGNOSIS — Z7901 Long term (current) use of anticoagulants: Secondary | ICD-10-CM | POA: Diagnosis not present

## 2017-05-07 DIAGNOSIS — Z79899 Other long term (current) drug therapy: Secondary | ICD-10-CM | POA: Diagnosis not present

## 2017-05-07 DIAGNOSIS — I481 Persistent atrial fibrillation: Secondary | ICD-10-CM | POA: Insufficient documentation

## 2017-05-07 DIAGNOSIS — E119 Type 2 diabetes mellitus without complications: Secondary | ICD-10-CM | POA: Diagnosis not present

## 2017-05-07 DIAGNOSIS — I10 Essential (primary) hypertension: Secondary | ICD-10-CM | POA: Insufficient documentation

## 2017-05-07 DIAGNOSIS — Z888 Allergy status to other drugs, medicaments and biological substances status: Secondary | ICD-10-CM | POA: Diagnosis not present

## 2017-05-07 DIAGNOSIS — I4891 Unspecified atrial fibrillation: Secondary | ICD-10-CM | POA: Diagnosis present

## 2017-05-07 HISTORY — PX: CARDIOVERSION: SHX1299

## 2017-05-07 LAB — GLUCOSE, CAPILLARY: Glucose-Capillary: 140 mg/dL — ABNORMAL HIGH (ref 65–99)

## 2017-05-07 SURGERY — CARDIOVERSION
Anesthesia: General

## 2017-05-07 MED ORDER — SODIUM CHLORIDE 0.9 % IV SOLN
INTRAVENOUS | Status: DC | PRN
  Administered 2017-05-07: 10:00:00 via INTRAVENOUS

## 2017-05-07 MED ORDER — LIDOCAINE 2% (20 MG/ML) 5 ML SYRINGE
INTRAMUSCULAR | Status: DC | PRN
  Administered 2017-05-07: 60 mg via INTRAVENOUS

## 2017-05-07 MED ORDER — PROPOFOL 10 MG/ML IV BOLUS
INTRAVENOUS | Status: DC | PRN
  Administered 2017-05-07: 100 mg via INTRAVENOUS

## 2017-05-07 NOTE — Discharge Instructions (Signed)
Electrical Cardioversion, Care After °This sheet gives you information about how to care for yourself after your procedure. Your health care provider may also give you more specific instructions. If you have problems or questions, contact your health care provider. °What can I expect after the procedure? °After the procedure, it is common to have: °· Some redness on the skin where the shocks were given. ° °Follow these instructions at home: °· Do not drive for 24 hours if you were given a medicine to help you relax (sedative). °· Take over-the-counter and prescription medicines only as told by your health care provider. °· Ask your health care provider how to check your pulse. Check it often. °· Rest for 48 hours after the procedure or as told by your health care provider. °· Avoid or limit your caffeine use as told by your health care provider. °Contact a health care provider if: °· You feel like your heart is beating too quickly or your pulse is not regular. °· You have a serious muscle cramp that does not go away. °Get help right away if: °· You have discomfort in your chest. °· You are dizzy or you feel faint. °· You have trouble breathing or you are short of breath. °· Your speech is slurred. °· You have trouble moving an arm or leg on one side of your body. °· Your fingers or toes turn cold or blue. °This information is not intended to replace advice given to you by your health care provider. Make sure you discuss any questions you have with your health care provider. °Document Released: 04/14/2013 Document Revised: 01/26/2016 Document Reviewed: 12/29/2015 °Elsevier Interactive Patient Education © 2018 [employer] ° °

## 2017-05-07 NOTE — Interval H&P Note (Signed)
History and Physical Interval Note:  05/07/2017 10:21 AM  Crystal Roth  has presented today for surgery, with the diagnosis of AFIB  The various methods of treatment have been discussed with the patient and family. After consideration of risks, benefits and other options for treatment, the patient has consented to  Procedure(s): CARDIOVERSION (N/A) as a surgical intervention .  The patient's history has been reviewed, patient examined, no change in status, stable for surgery.  I have reviewed the patient's chart and labs.  Questions were answered to the patient's satisfaction.     Chilton Siiffany [place], MD

## 2017-05-07 NOTE — CV Procedure (Signed)
Electrical Cardioversion Procedure Note Crystal HaradaDana J Roth [id number] 1951-01-01  Procedure: Electrical Cardioversion Indications:  Atrial Fibrillation  Procedure Details Consent: Risks of procedure as well as the alternatives and risks of each were explained to the (patient/caregiver).  Consent for procedure obtained. Time Out: Verified patient identification, verified procedure, site/side was marked, verified correct patient position, special equipment/implants available, medications/allergies/relevent history reviewed, required imaging and test results available.  Performed  Patient placed on cardiac monitor, pulse oximetry, supplemental oxygen as necessary.  Sedation given: propofol Pacer pads placed anterior and posterior chest.  Cardioverted 1 time(s).  Cardioverted at 150J.  Evaluation Findings: Post procedure EKG shows: NSR Complications: None Patient did tolerate procedure well.   Chilton Siiffany [place], MD 05/07/2017, 10:26 AM

## 2017-05-07 NOTE — Anesthesia Postprocedure Evaluation (Signed)
Anesthesia Post Note  Patient: Crystal Roth  Procedure(s) Performed: CARDIOVERSION (N/A )     Patient location during evaluation: PACU Anesthesia Type: General Level of consciousness: awake and alert Pain management: pain level controlled Vital Signs Assessment: post-procedure vital signs reviewed and stable Respiratory status: spontaneous breathing, nonlabored ventilation, respiratory function stable and patient connected to nasal cannula oxygen Cardiovascular status: blood pressure returned to baseline and stable Postop Assessment: no apparent nausea or vomiting Anesthetic complications: no    Last Vitals:  Vitals:   05/07/17 1050 05/07/17 1100  BP: 105/62 (!) 124/49  Pulse: 78 77  Resp: 16 16  Temp:    SpO2: 96% 98%    Last Pain:  Vitals:   05/07/17 1040  TempSrc: Oral                 Crystal Roth

## 2017-05-07 NOTE — Transfer of Care (Signed)
Immediate Anesthesia Transfer of Care Note  Patient: Crystal Roth  Procedure(s) Performed: CARDIOVERSION (N/A )  Patient Location: PACU and Endoscopy Unit  Anesthesia Type:General  Level of Consciousness: awake, patient cooperative and responds to stimulation  Airway & Oxygen Therapy: Patient Spontanous Breathing  Post-op Assessment: Report given to RN and Post -op Vital signs reviewed and stable  Post vital signs: Reviewed and stable  Last Vitals:  Vitals:   05/07/17 0953  BP: (!) 161/94  Pulse: (!) 137  Resp: 16  SpO2: 94%    Last Pain:  Vitals:   05/07/17 0953  TempSrc: Oral         Complications: No apparent anesthesia complications

## 2017-05-07 NOTE — H&P (View-Only) (Signed)
Primary Care Physician: Merlyn AlbertLuking, William S, MD Referring Physician: Dr. Remonia RichterKlein   Crystal Roth is a 66 y.o. female with a h/o paroxymal afib that is in the afib clinic for onset of persistent afib 10/30. She saw Dr. Graciela HusbandsKlein in January of the year at which time he increased her flecainide to 150 mg bid as he did not have any other options to manage her afib.Her Qtc is too long for Tikosyn. On the young side for amiodarone.Not a good candidate for ablation. Her weight is up 9 lbs. Has had some recent family stressors lately. She has not missed any doses of eliquis.    Today, she denies symptoms of palpitations, chest pain, shortness of breath, orthopnea, PND, lower extremity edema, dizziness, presyncope, syncope, or neurologic sequela. The patient is tolerating medications without difficulties and is otherwise without complaint today.   Past Medical History:  Diagnosis Date  . Atrial fibrillation (HCC)    dx 5/13; Apixaban; echo 5/13: mild LVH, EF 60-65%, mod LAe, mild RAE, PASP 29  . Diabetes mellitus   . Fatty liver   . Hypertension   . kidney calculus   . Morbid obesity (HCC)   . PONV (postoperative nausea and vomiting)   . Reactive airway disease    Past Surgical History:  Procedure Laterality Date  . ABDOMINAL HYSTERECTOMY    . CARDIOVERSION  12/27/2011   Procedure: CARDIOVERSION;  Surgeon: Duke SalviaSteven C Klein, MD;  [facility];  Service: Cardiovascular;  Laterality: N/A;  . CARDIOVERSION  01/17/2012   Procedure: CARDIOVERSION;  Surgeon: Duke SalviaSteven C Klein, MD;  [facility];  Service: Cardiovascular;  Laterality: N/A;  . CARDIOVERSION N/A 03/15/2016   Procedure: CARDIOVERSION;  Surgeon: Vesta MixerPhilip J Nahser, MD;  [facility];  Service: Cardiovascular;  Laterality: N/A;  . DILATION AND CURETTAGE OF UTERUS    . FOOT SURGERY    . TUBAL LIGATION      Current Outpatient Prescriptions  Medication Sig Dispense Refill  . acetaminophen (TYLENOL) 500 MG tablet Take 500 mg by mouth  every 6 (six) hours as needed. Pain     . allopurinol (ZYLOPRIM) 300 MG tablet Take 300 mg by mouth daily.    . benazepril (LOTENSIN) 20 MG tablet Take 20 mg by mouth daily.    . Cholecalciferol (VITAMIN D3) 5000 units TBDP Take by mouth.    . co-enzyme Q-10 30 MG capsule Take 30 mg by mouth 3 (three) times daily.    . Cyanocobalamin (B-12 COMPLIANCE INJECTION IJ) Inject as directed.    . diltiazem (CARDIZEM) 30 MG tablet Take 1 tablet every 4 hours AS NEEDED for afib rapid heart rate over 100 45 tablet 1  . ELIQUIS 5 MG TABS tablet TAKE ONE TABLET BY MOUTH TWICE DAILY **CALL  OFFICE  AND  MAKE  1  YEAR  FOLLOW  UP  APPOINTMENT** 60 tablet 0  . flecainide (TAMBOCOR) 150 MG tablet Take 1 tablet (150 mg total) by mouth 2 (two) times daily. 180 tablet 3  . furosemide (LASIX) 20 MG tablet TAKE ONE TABLET BY MOUTH ONCE DAILY (Patient taking differently: TAKE ONE TABLET BY MOUTH ONCE DAILY as needed) 90 tablet 2  . glipiZIDE (GLUCOTROL) 5 MG tablet Take by mouth 2 (two) times daily before a meal.    . MAGNESIUM PO Take 400 mg by mouth 2 (two) times daily.     . metFORMIN (GLUCOPHAGE) 500 MG tablet Take by mouth 2 (two) times daily with a meal. 1 to 2  tablets twice a day    . Multiple Vitamins-Minerals (OCUVITE EXTRA PO) Take by mouth.    . potassium chloride SA (KLOR-CON M20) 20 MEQ tablet Take 1 tablet (20 mEq total) by mouth daily. 90 tablet 3  . PROVENTIL HFA 108 (90 BASE) MCG/ACT inhaler INHALE ONE TO TWO PUFFS INTO LUNGS EVERY 6 HOURS AS NEEDED FOR WHEEZING 7 each 2  . torsemide (DEMADEX) 10 MG tablet Take 10 mg by mouth daily. Take an extra 10 mg if needed for fluid gain >3 lbs; alternate with the furosemide.  Do not take torsemide and furosemide together    . VITAMIN B COMPLEX-C CAPS Take by mouth.    Marland Kitchen UNABLE TO FIND Take 2 tablets by mouth 2 (two) times daily at 10 AM and 5 PM. Mitochondrial NRG     . UNKNOWN TO PATIENT Take 1 Dose by mouth daily.      No current facility-administered  medications for this encounter.     Allergies  Allergen Reactions  . Nsaids Shortness Of Breath  . Metoprolol Other (See Comments)    Pt gained 22 lbs in 2 weeks. Depression. Pt states in made blood sugar irregular as well as caused bradycardia    Social History   Social History  . Marital status: Married    Spouse name: N/A  . Number of children: N/A  . Years of education: N/A   Occupational History  . Not on file.   Social History Main Topics  . Smoking status: Never Smoker  . Smokeless tobacco: Never Used  . Alcohol use No  . Drug use: No  . Sexual activity: Not on file   Other Topics Concern  . Not on file   Social History Narrative  . No narrative on file    Family History  Problem Relation Age of Onset  . Parkinsonism Mother   . Osteoarthritis Father     ROS- All systems are reviewed and negative except as per the HPI above  Physical Exam: Vitals:   05/06/17 1400  BP: (!) 156/82  Pulse: (!) 109  SpO2: 94%  Weight: (!) 310 lb 3.2 oz (140.7 kg)  Height: 5\' 6"  (1.676 m)   Wt Readings from Last 3 Encounters:  05/06/17 (!) 310 lb 3.2 oz (140.7 kg)  11/27/16 (!) 301 lb 12.8 oz (136.9 kg)  08/27/16 282 lb (127.9 kg)    Labs: Lab Results  Component Value Date   NA 140 04/01/2016   K 3.6 04/01/2016   CL 103 04/01/2016   CO2 28 04/01/2016   GLUCOSE 108 (H) 04/01/2016   BUN 14 04/01/2016   CREATININE 0.66 04/01/2016   CALCIUM 9.3 04/01/2016   MG 1.7 04/01/2016   No results found for: INR Lab Results  Component Value Date   CHOL 216 (H) 03/04/2013   HDL 64 03/04/2013   LDLCALC 124 (H) 03/04/2013   TRIG 142 03/04/2013     GEN- The patient is well appearing, alert and oriented x 3 today.   Head- normocephalic, atraumatic Eyes-  Sclera clear, conjunctiva pink Ears- hearing intact Oropharynx- clear Neck- supple, no JVP Lymph- no cervical lymphadenopathy Lungs- Clear to ausculation bilaterally, normal work of breathing Heart-irregular rate  and rhythm, no murmurs, rubs or gallops, PMI not laterally displaced GI- soft, NT, ND, + BS Extremities- no clubbing, cyanosis, or edema MS- no significant deformity or atrophy Skin- no rash or lesion Psych- euthymic mood, full affect Neuro- strength and sensation are intact  EKG-afib at 109  bpm, qrs int 112 ms, qtc 487 ms Epic records reviewed    Assessment and Plan: 1. Paroxysmal afib Afib quiet since earlier in year when flecainide was increased to 150 mg bid However, recent breakthrough on flecainide and will set up for cardioversion tommorrow No missed doses of eliquis Unfortunately do not have any better options for pt to keep in SR  Work note given to return to work Thursday at noon Bmet/mag  F/u after cardioversion with Dr. Graciela Husbands in the next month  Lupita Leash C. Matthew Folks [facility] [address] [place], [place] [address] [phone]

## 2017-05-07 NOTE — Anesthesia Preprocedure Evaluation (Addendum)
Anesthesia Evaluation  Patient identified by MRN, date of birth, ID band Patient awake    Reviewed: Allergy & Precautions, NPO status , Patient's Chart, lab work & pertinent test results  History of Anesthesia Complications (+) PONV  Airway Mallampati: II  TM Distance: <3 FB Neck ROM: Full    Dental  (+) Teeth Intact, Dental Advisory Given, Chipped,    Pulmonary neg pulmonary ROS,    breath sounds clear to auscultation       Cardiovascular hypertension, Pt. on medications + dysrhythmias Atrial Fibrillation  Rhythm:Irregular Rate:Normal  TTE 2017 - Mild focal basal hypertrophy of the septum.   Ejection fraction was in the range of 55% to 60%. Left atrium mildly dilated. PASP was mildly increased. PA   peak pressure: 36 mm Hg (S).   Neuro/Psych negative neurological ROS  negative psych ROS   GI/Hepatic negative GI ROS, Neg liver ROS,   Endo/Other  diabetes, Type 2, Oral Hypoglycemic AgentsMorbid obesity  Renal/GU Kidney calculus     Musculoskeletal negative musculoskeletal ROS (+)   Abdominal (+) + obese,   Peds  Hematology negative hematology ROS (+)   Anesthesia Other Findings Gout  Reproductive/Obstetrics                           Anesthesia Physical  Anesthesia Plan  ASA: IV  Anesthesia Plan: General   Post-op Pain Management:    Induction: Intravenous  PONV Risk Score and Plan: Treatment may vary due to age or medical condition and Propofol infusion  Airway Management Planned: Natural Airway  Additional Equipment: None  Intra-op Plan:   Post-operative Plan:   Informed Consent: I have reviewed the patients History and Physical, chart, labs and discussed the procedure including the risks, benefits and alternatives for the proposed anesthesia with the patient or authorized representative who has indicated his/her understanding and acceptance.   Dental advisory given  Plan  Discussed with: CRNA  Anesthesia Plan Comments:        Anesthesia Quick Evaluation

## 2017-05-08 ENCOUNTER — Encounter (HOSPITAL_COMMUNITY): Payer: Self-pay | Admitting: Cardiovascular Disease

## 2017-06-06 ENCOUNTER — Ambulatory Visit (INDEPENDENT_AMBULATORY_CARE_PROVIDER_SITE_OTHER): Payer: No Typology Code available for payment source | Admitting: Internal Medicine

## 2017-06-06 ENCOUNTER — Encounter: Payer: Self-pay | Admitting: Internal Medicine

## 2017-06-06 VITALS — BP 134/74 | HR 85 | Ht 64.0 in | Wt 308.0 lb

## 2017-06-06 DIAGNOSIS — I480 Paroxysmal atrial fibrillation: Secondary | ICD-10-CM

## 2017-06-06 DIAGNOSIS — I4892 Unspecified atrial flutter: Secondary | ICD-10-CM | POA: Diagnosis not present

## 2017-06-06 MED ORDER — DILTIAZEM HCL 120 MG PO TABS: ORAL_TABLET | ORAL | 3 refills | Status: DC

## 2017-06-06 NOTE — Patient Instructions (Addendum)
Medication Instructions:  Your physician has recommended you make the following change in your medication:   1.) START Cardizem 120 mg by mouth daily  -- If you need a refill on your cardiac medications before your next appointment, please call your pharmacy. --  Labwork: None ordered  Testing/Procedures: Your physician has recommended that you have a home sleep study.  Crystal Roth will coordinate with you. This test records several body functions during sleep, including: brain activity, eye movement, oxygen and carbon dioxide blood levels, heart rate and rhythm, breathing rate and rhythm, the flow of air through your mouth and nose, snoring, body muscle movements, and chest and belly movement.   Follow-Up: Your physician wants you to follow-up in: 6 months with [name] and Dr. Graciela HusbandsKlein in 1 year.  You will receive a reminder letter in the mail two months in advance. If you don't receive a letter, please call our office to schedule the follow-up appointment.  Thank you for choosing [facility]!!   [phone]  Any Other Special Instructions Will Be Listed Below (If Applicable).

## 2017-06-06 NOTE — Progress Notes (Signed)
You with your home sleep study Crystal Roth     Patient Care Team: Hillary Bowrowley, McKay, MD as PCP - General (Internal Medicine)   HPI  Crystal HaradaDana J Roth is a 66 y.o. female Seen in follow-up for paroxysmal atrial fibrillation for which she has taken apixoban and flecainide.     She has not felt well over the last few months with shortness of breath palpitations and fatigue; she's also had problems with edema which has been improved by the switching from furosemide--torsemide  CHADS-VASc score is 3 (Gender, hypertension, diabetes.) DATE PR interval QRSduration Flecainide  1/18  162 102 100  11/18 146 108 150   Intercurrently developed atrial fibrillation and underwent cardioversion 10/18.  Had been in sinus rhythm for about a year prior to this.  She has been on diltiazem taking 30 mg twice a day.  She feels significantly better.  Undergoing significant stress, with son on paid leave from police work and daughter with ovarian cancer   Echocardiogram 10/17 normal LV function LA size 35  Records and Results Reviewed A. fib office notes  Sleep study negative about 5 yrsa ago and 30 lbs ago  Sleep disordered breathing   Past Medical History:  Diagnosis Date  . Atrial fibrillation (HCC)    dx 5/13; Apixaban; echo 5/13: mild LVH, EF 60-65%, mod LAe, mild RAE, PASP 29  . Diabetes mellitus   . Fatty liver   . Hypertension   . kidney calculus   . Morbid obesity (HCC)   . PONV (postoperative nausea and vomiting)   . Reactive airway disease     Past Surgical History:  Procedure Laterality Date  . ABDOMINAL HYSTERECTOMY    . CARDIOVERSION  12/27/2011   Procedure: CARDIOVERSION;  Surgeon: Duke SalviaSteven C Klein, MD;  [facility];  Service: Cardiovascular;  Laterality: N/A;  . CARDIOVERSION  01/17/2012   Procedure: CARDIOVERSION;  Surgeon: Duke SalviaSteven C Klein, MD;  [facility];  Service: Cardiovascular;  Laterality: N/A;  . CARDIOVERSION N/A 03/15/2016   Procedure: CARDIOVERSION;  Surgeon:  Vesta MixerPhilip J Nahser, MD;  [facility];  Service: Cardiovascular;  Laterality: N/A;  . CARDIOVERSION N/A 05/07/2017   Procedure: CARDIOVERSION;  Surgeon: Chilton Siandolph, Tiffany, MD;  [facility];  Service: Cardiovascular;  Laterality: N/A;  . DILATION AND CURETTAGE OF UTERUS    . FOOT SURGERY    . TUBAL LIGATION      Current Outpatient Medications  Medication Sig Dispense Refill  . acetaminophen (TYLENOL) 500 MG tablet Take 500 mg by mouth every 6 (six) hours as needed. Pain     . allopurinol (ZYLOPRIM) 300 MG tablet Take 300 mg by mouth daily.    . benazepril (LOTENSIN) 20 MG tablet Take 20 mg by mouth daily.    . Cholecalciferol (VITAMIN D3) 5000 units TBDP Take by mouth.    . diltiazem (CARDIZEM) 30 MG tablet Take 1 tablet every 4 hours AS NEEDED for afib rapid heart rate over 100 45 tablet 1  . ELIQUIS 5 MG TABS tablet TAKE ONE TABLET BY MOUTH TWICE DAILY **CALL  OFFICE  AND  MAKE  1  YEAR  FOLLOW  UP  APPOINTMENT** 60 tablet 0  . flecainide (TAMBOCOR) 150 MG tablet Take 1 tablet (150 mg total) by mouth 2 (two) times daily. 180 tablet 3  . furosemide (LASIX) 20 MG tablet TAKE ONE TABLET BY MOUTH ONCE DAILY (Patient taking differently: TAKE ONE TABLET BY MOUTH ONCE DAILY as needed) 90 tablet 2  . glipiZIDE (GLUCOTROL) 5  MG tablet Take by mouth 2 (two) times daily before a meal.    . MAGNESIUM PO Take 400 mg by mouth 2 (two) times daily.     . metFORMIN (GLUCOPHAGE) 500 MG tablet Take by mouth 2 (two) times daily with a meal. 1 to 2 tablets twice a day    . Multiple Vitamins-Minerals (OCUVITE EXTRA PO) Take by mouth.    . potassium chloride SA (KLOR-CON M20) 20 MEQ tablet Take 1 tablet (20 mEq total) by mouth daily. 90 tablet 3  . PROVENTIL HFA 108 (90 BASE) MCG/ACT inhaler INHALE ONE TO TWO PUFFS INTO LUNGS EVERY 6 HOURS AS NEEDED FOR WHEEZING 7 each 2  . torsemide (DEMADEX) 10 MG tablet Take 10 mg by mouth daily. Take an extra 10 mg if needed for fluid gain >3 lbs; alternate  with the furosemide.  Do not take torsemide and furosemide together    . UNKNOWN TO PATIENT Take 1 Dose by mouth daily.     Marland Kitchen. VITAMIN B COMPLEX-C CAPS Take by mouth.     No current facility-administered medications for this visit.     Allergies  Allergen Reactions  . Nsaids Shortness Of Breath  . Metoprolol Other (See Comments)    Pt gained 22 lbs in 2 weeks. Depression. Pt states in made blood sugar irregular as well as caused bradycardia      Review of Systems negative except from HPI and PMH  Physical Exam BP 134/74   Pulse 85   Ht 5\' 4"  (1.626 m)   Wt (!) 308 lb (139.7 kg)   SpO2 98%   BMI 52.87 kg/m  Well developed and nourished in no acute distress HENT normal Neck supple with JVP-flat Clear Regular rate and rhythm, no murmurs or gallops Abd-soft with active BS No Clubbing cyanosis edema Skin-warm and dry A & Oriented  Grossly normal sensory and motor function  ECG demonstrates sinus rhythm at 78 16/11/44 PVCs infreqnent  Assessment and  Plan Atrial flutter/fibrillation  Flecainide therapy    Hypertension   Metoprolol intolerance   PVCs   Sleep disordered breathing/daytime somnolence    PVC burden appears less.  I wonder whether this is a benefit from the diltiazem.  We will initiated daily at 120 mg.  This is also appropriate in the context of chronic 1C therapy  Infrequent atrial fibrillation.  I told her that cardioversions once or twice a year would be a success.  We have reviewed the implications of her weight as relates to atrial fibrillation ablation.  Her BMI is far in excess of the 40  Blood pressure reasonably controlled.  Will do sleep study for high probability of sleep apnea  We spent more than 50% of our >25 min visit in face to face counseling regarding the above

## 2017-06-09 ENCOUNTER — Telehealth: Payer: Self-pay | Admitting: Internal Medicine

## 2017-06-09 MED ORDER — DILTIAZEM HCL ER COATED BEADS 120 MG PO CP24: 120.0000 mg | ORAL_CAPSULE | Freq: Every day | ORAL | 3 refills | Status: DC

## 2017-06-09 NOTE — Telephone Encounter (Signed)
New message   Pt c/o medication issue:  1. Name of Medication: diltiazem   2. How are you currently taking this medication (dosage and times per day)?  120mg  every 4 hours over 100   3. Are you having a reaction (difficulty breathing--STAT)? no  4. What is your medication issue? Medication is written wrong

## 2017-06-09 NOTE — Telephone Encounter (Signed)
Pt states that Dr Graciela HusbandsKlein recommended for her to take Diltiazem 120 mg once daily. This medication was send as to be taken Q 4 hrs a needed. The correct medication was send to pt's pharmacy as Cardizem CD 120 mg po daily. Pt is aware.

## 2017-06-09 NOTE — Telephone Encounter (Signed)
Left pt a message to call back. 

## 2017-06-11 ENCOUNTER — Other Ambulatory Visit: Payer: Self-pay | Admitting: Internal Medicine

## 2017-06-12 ENCOUNTER — Telehealth: Payer: Self-pay | Admitting: *Deleted

## 2017-06-12 ENCOUNTER — Other Ambulatory Visit: Payer: Self-pay | Admitting: *Deleted

## 2017-06-12 MED ORDER — APIXABAN 5 MG PO TABS: 5.0000 mg | ORAL_TABLET | Freq: Two times a day (BID) | ORAL | 5 refills | Status: DC

## 2017-06-12 NOTE — Telephone Encounter (Signed)
Home sleep study 

## 2017-06-12 NOTE — Telephone Encounter (Signed)
Age 66 Wt 139.7kg 06/06/2017 Saw Dr Graciela HusbandsKlein on 06/06/2017 05/06/2017 Sr Cr 0.81  Hrb 14.0 HCT 43.7 Refill done for Eliquis 5mg  q 12 hours as requested

## 2017-06-12 NOTE — Telephone Encounter (Signed)
-----   Message from Sigurd SosMichael Dapp, RN sent at 06/06/2017 10:25 AM EST ----- Regarding: Home Sleep study Per Dr. Graciela HusbandsKlein, patient to have home sleep study. Please schedule.

## 2017-06-19 NOTE — Telephone Encounter (Signed)
Informed patient of upcoming home sleep study and patient understanding was verbalized. Patient understands her sleep study will be done at Home with [facility] Patient understands she will receive a call in a week or so to set up her home sleep test. Patient understands to call if she does not receive the call in a timely manner. Patient agrees with treatment and thanked me for call. Home sleep test loaded.

## 2017-07-23 ENCOUNTER — Telehealth: Payer: Self-pay | Admitting: *Deleted

## 2017-07-23 NOTE — Telephone Encounter (Signed)
Late entry: Fax came for patient on 07/17/17 from [facility] stating they received the order for a home sleep study for the patient, however the patient has been canceled for the reason: Patient declined use of benefits and self pay option. Reached out to patient but there was no voicemail to leave a message on  ([phone]) [phone] or [phone].

## 2017-08-02 ENCOUNTER — Other Ambulatory Visit: Payer: Self-pay | Admitting: Internal Medicine

## 2017-08-02 ENCOUNTER — Other Ambulatory Visit (HOSPITAL_COMMUNITY): Payer: Self-pay | Admitting: Nurse Practitioner

## 2017-08-09 ENCOUNTER — Other Ambulatory Visit: Payer: Self-pay | Admitting: Internal Medicine

## 2017-12-17 ENCOUNTER — Other Ambulatory Visit: Payer: Self-pay | Admitting: Internal Medicine

## 2017-12-17 NOTE — Telephone Encounter (Signed)
Eliquis 5mg  refill request received; pt is 67 yrs old, wt-139.7kg, Crea-0.81 on 05/06/17, last seen by Dr. Graciela HusbandsKlein on 06/06/17; will send in refill to requested pharmacy.

## 2018-06-19 ENCOUNTER — Other Ambulatory Visit: Payer: Self-pay | Admitting: Internal Medicine

## 2018-06-19 MED ORDER — APIXABAN 5 MG PO TABS: 5.0000 mg | ORAL_TABLET | Freq: Two times a day (BID) | ORAL | 0 refills | Status: DC

## 2018-06-19 NOTE — Telephone Encounter (Signed)
Pt now has appt on 06/30/18 with Dr Graciela HusbandsKlein, notes placed on pt's appt to check CBC and BMP at OV.

## 2018-06-19 NOTE — Telephone Encounter (Signed)
°*  STAT* If patient is at the pharmacy, call can be transferred to refill team.   1. Which medications need to be refilled? (please list name of each medication and dose if known)  ELIQUIS 5 MG TABS tablet   2. Which pharmacy/location (including street and city if local pharmacy) is medication to be sent to?  [id number] - [place], [place] - [address]   3. Do they need a 30 day or 90 day supply?   90 days

## 2018-06-19 NOTE — Telephone Encounter (Signed)
Pt last saw Dr Graciela HusbandsKlein 06/06/17, overdue for follow-up. Has recalls in computer for Rudi CocoDonna Carroll, NP 11/2017 and Graciela HusbandsKlein 05/2018.  Pt's last labs were 05/06/17, overdue for labwork as well.  Age 67, weight 139.7kg, pt needs follow-up appt with labwork to refill rx.  Called pt, she states she has called our office and is awaiting a call back for an appt with Dr Graciela HusbandsKlein.  She states she had bloodwork recently at her primary care office.  Attempted to call their office however their only get a recording and their mailbox is full and unable to accept new messages. Pt states she only has 3 tablets of Eliquis left will refill x 30 days until pt can get appt to see Dr Graciela HusbandsKlein.  Will need CBC and BMP done at that time.

## 2018-06-29 NOTE — Progress Notes (Signed)
You with your home sleep study Casimiro NeedleMichael     Patient Care Team: Hillary Bowrowley, McKay, MD as PCP - General (Internal Medicine)   HPI  Crystal Roth is a 67 y.o. female Seen in follow-up for paroxysmal atrial fibrillation for which she has taken apixoban and flecainide. INntercurrent AFib paroxysms Dur < 24 hr treated with Dilt; tried treated previously with long acting stopped 2/2 bradycardia    She has not felt well over the last few months with shortness of breath palpitations and fatigue; she's also had problems with edema which has been improved by the switching from furosemide--torsemide  CHADS-VASc score is 3 (Gender, hypertension, diabetes.) DATE PR interval QRSduration Flecainide  6/13 202 84 0  1/18  162 102 100  11/18 146 108 150  12/19 180 110 150   DCCV  2017, 10/18 ;      DATE TEST EF   7/13 MYOVIEW 74% No Ischemia  10/17 Echo   55-65 % LA (41/1.7/41)        Date Cr K Hgb  10/18 0.81 3.8 14          Sleep study 2013  AHI 12; repeat study anticipated deferred for insurance    some paroxysms of afib-- all self limited, has stopped dilt 2/2 bradycardia  If misses flec much more freq palps  No chest pain  No bleeding   Past Medical History:  Diagnosis Date  . Atrial fibrillation (HCC)    dx 5/13; Apixaban; echo 5/13: mild LVH, EF 60-65%, mod LAe, mild RAE, PASP 29  . Diabetes mellitus   . Fatty liver   . Hypertension   . kidney calculus   . Morbid obesity (HCC)   . PONV (postoperative nausea and vomiting)   . Reactive airway disease     Past Surgical History:  Procedure Laterality Date  . ABDOMINAL HYSTERECTOMY    . CARDIOVERSION  12/27/2011   Procedure: CARDIOVERSION;  Surgeon: Duke SalviaSteven C Klein, MD;  [facility];  Service: Cardiovascular;  Laterality: N/A;  . CARDIOVERSION  01/17/2012   Procedure: CARDIOVERSION;  Surgeon: Duke SalviaSteven C Klein, MD;  [facility];  Service: Cardiovascular;  Laterality: N/A;  . CARDIOVERSION N/A 03/15/2016   Procedure:  CARDIOVERSION;  Surgeon: Vesta MixerPhilip J Nahser, MD;  [facility];  Service: Cardiovascular;  Laterality: N/A;  . CARDIOVERSION N/A 05/07/2017   Procedure: CARDIOVERSION;  Surgeon: Chilton Siandolph, Tiffany, MD;  [facility];  Service: Cardiovascular;  Laterality: N/A;  . DILATION AND CURETTAGE OF UTERUS    . FOOT SURGERY    . TUBAL LIGATION      Current Outpatient Medications  Medication Sig Dispense Refill  . acetaminophen (TYLENOL) 500 MG tablet Take 500 mg by mouth every 6 (six) hours as needed. Pain     . allopurinol (ZYLOPRIM) 300 MG tablet Take 300 mg by mouth daily.    Marland Kitchen. apixaban (ELIQUIS) 5 MG TABS tablet Take 1 tablet (5 mg total) by mouth 2 (two) times daily. 60 tablet 0  . benazepril (LOTENSIN) 20 MG tablet Take 20 mg by mouth daily.    . Cholecalciferol (VITAMIN D3) 5000 units TBDP Take by mouth.    . flecainide (TAMBOCOR) 150 MG tablet TAKE ONE TABLET BY MOUTH TWICE DAILY 180 tablet 3  . furosemide (LASIX) 20 MG tablet Take 20 mg by mouth as needed for fluid.    Marland Kitchen. glipiZIDE (GLUCOTROL) 5 MG tablet Take by mouth 2 (two) times daily before a meal.    . KLOR-CON M20  20 MEQ tablet TAKE ONE TABLET BY MOUTH ONCE DAILY 90 tablet 3  . MAGNESIUM PO Take 400 mg by mouth 2 (two) times daily.     . metFORMIN (GLUCOPHAGE) 500 MG tablet Take by mouth 2 (two) times daily with a meal. 1 to 2 tablets twice a day    . Multiple Vitamins-Minerals (OCUVITE EXTRA PO) Take by mouth.    Marland Kitchen. PROVENTIL HFA 108 (90 BASE) MCG/ACT inhaler INHALE ONE TO TWO PUFFS INTO LUNGS EVERY 6 HOURS AS NEEDED FOR WHEEZING 7 each 2  . torsemide (DEMADEX) 10 MG tablet Take 10 mg by mouth daily. Take an extra 10 mg if needed for fluid gain >3 lbs; alternate with the furosemide.  Do not take torsemide and furosemide together    . UNKNOWN TO PATIENT Take 1 Dose by mouth daily.     Marland Kitchen. VITAMIN B COMPLEX-C CAPS Take by mouth.    . diltiazem (CARDIZEM CD) 120 MG 24 hr capsule Take 1 capsule (120 mg total) by mouth daily.  90 capsule 3   No current facility-administered medications for this visit.     Allergies  Allergen Reactions  . Nsaids Shortness Of Breath  . Metoprolol Other (See Comments)    Pt gained 22 lbs in 2 weeks. Depression. Pt states in made blood sugar irregular as well as caused bradycardia      Review of Systems negative except from HPI and PMH  Physical Exam BP 114/72   Pulse 74   Ht 5\' 5"  (1.651 m)   Wt 240 lb (108.9 kg)   SpO2 97%   BMI 39.94 kg/m  Well developed and nourished in no acute distress HENT normal Neck supple with JVP-flat Clear Regular rate and rhythm, no murmurs or gallops Abd-soft with active BS No Clubbing cyanosis edema Skin-warm and dry A & Oriented  Grossly normal sensory and motor function  ECG sinus @ 74 18/11/45 Low amplitude P waves   Assessment and  Plan Atrial flutter/fibrillation  Flecainide therapy    Hypertension   Metoprolol intolerance   PVCs   Bradycardia  Sleep disordered breathing/daytime somnolence  Continue flecainide   QRS change is at upper limit for flec  She has stopped her diltiazem because of bradycardia which was quite symptomatic.  We will try her on acebutolol having reviewed with her the issue of atrial fibrillation conversion atrial flutter and conduction slowing on the 1C therapy we have reviewed side effects.  I do not know what to do about her sleep disordered breathing.  She takes her torsemide at night and sleeps for 2 hours at a time.  I will reach out to 1 of the sleep doctors and see if they could help me predict how we might interpret her data  Some volume overload.  She will continue her diuretics  Blood pressure well controlled  We spent more than 50% of our >25 min visit in face to face counseling regarding the above

## 2018-06-30 ENCOUNTER — Other Ambulatory Visit: Payer: Self-pay

## 2018-06-30 ENCOUNTER — Encounter: Payer: Self-pay | Admitting: Internal Medicine

## 2018-06-30 ENCOUNTER — Ambulatory Visit (INDEPENDENT_AMBULATORY_CARE_PROVIDER_SITE_OTHER): Payer: No Typology Code available for payment source | Admitting: Internal Medicine

## 2018-06-30 VITALS — BP 114/72 | HR 74 | Ht 65.0 in | Wt 240.0 lb

## 2018-06-30 DIAGNOSIS — I4892 Unspecified atrial flutter: Secondary | ICD-10-CM

## 2018-06-30 DIAGNOSIS — I480 Paroxysmal atrial fibrillation: Secondary | ICD-10-CM | POA: Diagnosis not present

## 2018-06-30 MED ORDER — ACEBUTOLOL HCL 200 MG PO CAPS: 200.0000 mg | ORAL_CAPSULE | Freq: Two times a day (BID) | ORAL | 3 refills | Status: DC

## 2018-06-30 NOTE — Patient Instructions (Signed)
Medication Instructions: Your physician has recommended you make the following change in your medication:   Stop Diltiazem  Start Acebutolol 200MG  tablet twice a day by mouth   Labwork: Please call with recent lab work, if not attached are orders to take to [facility] any day of the week within the next 2 weeks.  Procedures/Testing: None Ordered   Follow-Up: Your physician recommends that you schedule a follow-up appointment in: 6 months with Dr. Graciela HusbandsKlein   Any Additional Special Instructions Will Be Listed Below (If Applicable).     If you need a refill on your cardiac medications before your next appointment, please call your pharmacy.

## 2018-07-21 ENCOUNTER — Other Ambulatory Visit: Payer: Self-pay | Admitting: Internal Medicine

## 2018-08-12 ENCOUNTER — Other Ambulatory Visit: Payer: Self-pay | Admitting: Internal Medicine

## 2018-08-12 ENCOUNTER — Emergency Department (HOSPITAL_COMMUNITY)
Admission: EM | Admit: 2018-08-12 | Discharge: 2018-08-12 | Disposition: A | Discharge status: Home / Self Care | Payer: No Typology Code available for payment source | Attending: Emergency Medicine | Admitting: Emergency Medicine

## 2018-08-12 ENCOUNTER — Emergency Department (HOSPITAL_COMMUNITY): Payer: No Typology Code available for payment source

## 2018-08-12 ENCOUNTER — Other Ambulatory Visit: Payer: PRIVATE HEALTH INSURANCE | Admitting: *Deleted

## 2018-08-12 ENCOUNTER — Other Ambulatory Visit: Payer: Self-pay

## 2018-08-12 ENCOUNTER — Encounter (HOSPITAL_COMMUNITY): Payer: Self-pay | Admitting: *Deleted

## 2018-08-12 DIAGNOSIS — Z79899 Other long term (current) drug therapy: Secondary | ICD-10-CM | POA: Insufficient documentation

## 2018-08-12 DIAGNOSIS — E119 Type 2 diabetes mellitus without complications: Secondary | ICD-10-CM | POA: Insufficient documentation

## 2018-08-12 DIAGNOSIS — I4891 Unspecified atrial fibrillation: Secondary | ICD-10-CM | POA: Insufficient documentation

## 2018-08-12 DIAGNOSIS — Z87442 Personal history of urinary calculi: Secondary | ICD-10-CM | POA: Insufficient documentation

## 2018-08-12 DIAGNOSIS — Z7984 Long term (current) use of oral hypoglycemic drugs: Secondary | ICD-10-CM | POA: Insufficient documentation

## 2018-08-12 DIAGNOSIS — M5441 Lumbago with sciatica, right side: Secondary | ICD-10-CM | POA: Insufficient documentation

## 2018-08-12 DIAGNOSIS — M25551 Pain in right hip: Secondary | ICD-10-CM | POA: Diagnosis present

## 2018-08-12 LAB — BASIC METABOLIC PANEL
BUN/Creatinine Ratio: 19 (ref 12–28)
BUN: 14 mg/dL (ref 8–27)
CHLORIDE: 97 mmol/L (ref 96–106)
CO2: 26 mmol/L (ref 20–29)
CREATININE: 0.72 mg/dL (ref 0.57–1.00)
Calcium: 9.5 mg/dL (ref 8.7–10.3)
GFR calc Af Amer: 101 mL/min/{1.73_m2} (ref >59)
GFR calc non Af Amer: 88 mL/min/{1.73_m2} (ref >59)
GLUCOSE: 120 mg/dL — AB (ref 65–99)
Potassium: 3.4 mmol/L — ABNORMAL LOW (ref 3.5–5.2)
SODIUM: 140 mmol/L (ref 134–144)

## 2018-08-12 LAB — URINALYSIS, ROUTINE W REFLEX MICROSCOPIC
Bilirubin Urine: NEGATIVE
Glucose, UA: NEGATIVE mg/dL
Hgb urine dipstick: NEGATIVE
Ketones, ur: NEGATIVE mg/dL
Leukocytes, UA: NEGATIVE
Nitrite: NEGATIVE
Protein, ur: NEGATIVE mg/dL
Specific Gravity, Urine: 1.016 (ref 1.005–1.030)
pH: 6 (ref 5.0–8.0)

## 2018-08-12 MED ORDER — HYDROCODONE-ACETAMINOPHEN 5-325 MG PO TABS: 1.0000 | ORAL_TABLET | ORAL | 0 refills | Status: DC | PRN

## 2018-08-12 MED ORDER — LIDOCAINE 5 % EX PTCH: 1.0000 | MEDICATED_PATCH | CUTANEOUS | 0 refills | Status: AC

## 2018-08-12 MED ORDER — CYCLOBENZAPRINE HCL 5 MG PO TABS: 5.0000 mg | ORAL_TABLET | Freq: Three times a day (TID) | ORAL | 0 refills | Status: DC | PRN

## 2018-08-12 MED ORDER — HYDROCODONE-ACETAMINOPHEN 5-325 MG PO TABS
2.0000 | ORAL_TABLET | Freq: Once | ORAL | Status: AC
  Administered 2018-08-12: 2 via ORAL
  Filled 2018-08-12: qty 2

## 2018-08-12 NOTE — ED Provider Notes (Signed)
[facility] Provider Note   CSN: [id number] Arrival date & time: 08/12/18  [id number]     History   Chief Complaint Chief Complaint  Patient presents with  . Hip Pain  . Back Pain    HPI Crystal Roth is a 68 y.o. female.  HPI  68 year old female presents with right hip/back pain.  She has had hip pain on and off for a while and for about 2 weeks she has been having pain in her right hip and low back/sacrum.  No trauma.  No fevers, night sweats, or unintentional weight loss.  Pain is much more severe over the last 3 or 4 days.  Significant pain whenever she tries to walk.  She has been taking Tylenol and took some leftover Percocet and Flexeril.  No abdominal pain, urinary symptoms, or weakness/numbness in the legs.  No incontinence of her bowels and she has chronic incontinence of her urine.  The pain occasionally radiates to her right groin and down the posterior aspect of her leg into her calf. Currently rated as a 5.  Past Medical History:  Diagnosis Date  . Atrial fibrillation (HCC)    dx 5/13; Apixaban; echo 5/13: mild LVH, EF 60-65%, mod LAe, mild RAE, PASP 29  . Diabetes mellitus   . Fatty liver   . Hypertension   . kidney calculus   . Morbid obesity (HCC)   . PONV (postoperative nausea and vomiting)   . Reactive airway disease     Patient Active Problem List   Diagnosis Date Noted  . Essential hypertension, benign 03/14/2013  . Hyperlipidemia LDL goal <100 03/14/2013  . Diabetes (HCC) 03/14/2013  . Morbid obesity (HCC) 03/14/2013  . Atrial fibrillation 11/06/2011  . Preop cardiovascular exam 11/06/2011    Past Surgical History:  Procedure Laterality Date  . ABDOMINAL HYSTERECTOMY    . CARDIOVERSION  12/27/2011   Procedure: CARDIOVERSION;  Surgeon: Duke SalviaSteven C Klein, MD;  [facility];  Service: Cardiovascular;  Laterality: N/A;  . CARDIOVERSION  01/17/2012   Procedure: CARDIOVERSION;  Surgeon: Duke SalviaSteven C Klein, MD;  [facility];  Service: Cardiovascular;  Laterality: N/A;  . CARDIOVERSION N/A 03/15/2016   Procedure: CARDIOVERSION;  Surgeon: Vesta MixerPhilip J Nahser, MD;  [facility];  Service: Cardiovascular;  Laterality: N/A;  . CARDIOVERSION N/A 05/07/2017   Procedure: CARDIOVERSION;  Surgeon: Chilton Siandolph, Tiffany, MD;  [facility];  Service: Cardiovascular;  Laterality: N/A;  . DILATION AND CURETTAGE OF UTERUS    . FOOT SURGERY    . TUBAL LIGATION       OB History   No obstetric history on file.      Home Medications    Prior to Admission medications   Medication Sig Start Date End Date Taking? Authorizing Provider  acebutolol (SECTRAL) 200 MG capsule Take 1 capsule (200 mg total) by mouth 2 (two) times daily. 06/30/18   Duke SalviaKlein, Steven C, MD  acetaminophen (TYLENOL) 500 MG tablet Take 500 mg by mouth every 6 (six) hours as needed. Pain     [provider]  allopurinol (ZYLOPRIM) 300 MG tablet Take 300 mg by mouth daily.    [provider]  benazepril (LOTENSIN) 20 MG tablet Take 20 mg by mouth daily.    [provider]  Cholecalciferol (VITAMIN D3) 5000 units TBDP Take by mouth.    [provider]  cyclobenzaprine (FLEXERIL) 5 MG tablet Take 1 tablet (5 mg total) by mouth 3 (three) times daily  as needed for muscle spasms. 08/12/18   Pricilla Loveless, MD  ELIQUIS 5 MG TABS tablet TAKE 1 TABLET BY MOUTH TWICE DAILY 07/21/18   Duke Salvia, MD  flecainide [facility]) 150 MG tablet TAKE ONE TABLET BY MOUTH TWICE DAILY 08/04/17   Duke Salvia, MD  furosemide (LASIX) 20 MG tablet Take 20 mg by mouth as needed for fluid.    [provider]  glipiZIDE (GLUCOTROL) 5 MG tablet Take by mouth 2 (two) times daily before a meal.    [provider]  HYDROcodone-acetaminophen (NORCO) 5-325 MG tablet Take 1 tablet by mouth every 4 (four) hours as needed for severe pain. 08/12/18   Pricilla Loveless, MD  KLOR-CON M20 20 MEQ tablet TAKE ONE TABLET BY MOUTH ONCE  DAILY 08/11/17   Duke Salvia, MD  lidocaine (LIDODERM) 5 % Place 1 patch onto the skin daily. Remove & Discard patch within 12 hours or as directed by MD 08/12/18   Pricilla Loveless, MD  MAGNESIUM PO Take 400 mg by mouth 2 (two) times daily.     [provider]  metFORMIN (GLUCOPHAGE) 500 MG tablet Take by mouth 2 (two) times daily with a meal. 1 to 2 tablets twice a day    [provider]  Multiple Vitamins-Minerals (OCUVITE EXTRA PO) Take by mouth.    [provider]  PROVENTIL HFA 108 (90 BASE) MCG/ACT inhaler INHALE ONE TO TWO PUFFS INTO LUNGS EVERY 6 HOURS AS NEEDED FOR WHEEZING 05/23/14   Luking, Jonna Coup, MD  torsemide (DEMADEX) 10 MG tablet Take 10 mg by mouth daily. Take an extra 10 mg if needed for fluid gain >3 lbs; alternate with the furosemide.  Do not take torsemide and furosemide together    [provider]  UNKNOWN TO PATIENT Take 1 Dose by mouth daily.     [provider]  VITAMIN B COMPLEX-C CAPS Take by mouth.    [provider]    Family History Family History  Problem Relation Age of Onset  . Parkinsonism Mother   . Osteoarthritis Father     Social History Social History   Tobacco Use  . Smoking status: Never Smoker  . Smokeless tobacco: Never Used  Substance Use Topics  . Alcohol use: No  . Drug use: No     Allergies   Nsaids and Metoprolol   Review of Systems Review of Systems  Constitutional: Negative for diaphoresis, fever and unexpected weight change.  Gastrointestinal: Negative for abdominal pain.  Genitourinary: Negative for dysuria and hematuria.  Musculoskeletal: Positive for arthralgias and back pain.  Neurological: Negative for weakness and numbness.  All other systems reviewed and are negative.    Physical Exam Updated Vital Signs BP 117/73   Pulse 66   Temp 97.7 F (36.5 C) (Oral)   Resp 18   SpO2 100%   Physical Exam Vitals signs and nursing note reviewed.  Constitutional:        General: She is not in acute distress.    Appearance: She is well-developed. She is obese. She is not ill-appearing or diaphoretic.  HENT:     Head: Normocephalic and atraumatic.     Right Ear: External ear normal.     Left Ear: External ear normal.     Nose: Nose normal.  Eyes:     General:        Right eye: No discharge.        Left eye: No discharge.  Cardiovascular:  Rate and Rhythm: Normal rate and regular rhythm.     Pulses:          Dorsalis pedis pulses are 2+ on the right side and 2+ on the left side.     Heart sounds: Normal heart sounds.  Pulmonary:     Effort: Pulmonary effort is normal.     Breath sounds: Normal breath sounds.  Abdominal:     Palpations: Abdomen is soft.     Tenderness: There is no abdominal tenderness.  Musculoskeletal:     Right hip: She exhibits normal range of motion and no tenderness.     Lumbar back: She exhibits tenderness (mild).       Back:  Skin:    General: Skin is warm and dry.  Neurological:     Mental Status: She is alert.     Comments: Normal strength/sensation in BLE.  Psychiatric:        Mood and Affect: Mood is not anxious.      ED Treatments / Results  Labs (all labs ordered are listed, but only abnormal results are displayed) Labs Reviewed  URINALYSIS, ROUTINE W REFLEX MICROSCOPIC    EKG None  Radiology Dg Lumbar Spine Complete  Result Date: 08/12/2018 CLINICAL DATA:  Hip and back pain EXAM: LUMBAR SPINE - COMPLETE 4+ VIEW COMPARISON:  None. FINDINGS: Generalized degenerative disc narrowing and endplate spurring. There is lower lumbar facet arthropathy with prominent spurring at L4-5 and L5-S1. Grade 1 anterolisthesis at L4-5. There is mild retrolisthesis at L1-2 and L2-3. No evident fracture, bone lesion, or endplate erosion. Atherosclerosis. IMPRESSION: 1. No acute finding. 2. Generalized disc degeneration and lower lumbar facet arthropathy with L4-5 anterolisthesis. Electronically Signed   By: Marnee SpringJonathon   Watts M.D.   On: 08/12/2018 08:39   Dg Hip Unilat W Or Wo Pelvis 2-3 Views Right  Result Date: 08/12/2018 CLINICAL DATA:  Right hip pain. EXAM: DG HIP (WITH OR WITHOUT PELVIS) 2-3V RIGHT COMPARISON:  None. FINDINGS: Right hip is located. Mild degenerative changes are present. No acute or healing fractures are present. Visualized pelvis is normal. Degenerative changes are noted in the SI joints bilaterally. IMPRESSION: 1. Mild degenerative change. 2. No acute abnormality. Electronically Signed   By: Marin Robertshristopher  Mattern M.D.   On: 08/12/2018 08:48    Procedures Ultrasound ED Abd Date/Time: 08/12/2018 9:20 AM Performed by: Pricilla LovelessGoldston, Kyannah Climer, MD Authorized by: Pricilla LovelessGoldston, Urie Loughner, MD   Procedure details:    Indications: back pain     Assessment for:  AAA   Aorta:  Visualized      Study Limitations: body habitus and bowel gas Vascular findings:    Aorta: aorta normal (< 3cm)     Intra-abdominal fluid: unidentified     (including critical care time)  Medications Ordered in ED Medications  HYDROcodone-acetaminophen (NORCO/VICODIN) 5-325 MG per tablet 2 tablet (2 tablets Oral Given 08/12/18 0845)     Initial Impression / Assessment and Plan / ED Course  I have reviewed the triage vital signs and the nursing notes.  Pertinent labs & imaging results that were available during my care of the patient were reviewed by me and considered in my medical decision making (see chart for details).     Presentation is most consistent with sciatica.  She is neurovascularly intact.  Given age, x-rays obtained and these are benign besides some arthritis in her lumbar spine.  Also given age, while much less likely, ultrasound was performed to help rule out AAA.  While it was a  semi-difficult study, there does not appear to be an obvious aneurysm.  I think AAA is pretty unlikely.  No hematuria and the pain is a little too low to be flank pain.  I think UTI and/or renal colic is unlikely.  We discussed pain control  and likely need for an outpatient MRI for what is likely sciatica.  She has diabetes and would not want steroids anyway.  Would be of questionable benefit.  Discussed return precautions but at this point I think she is stable for outpatient follow-up and MRI with return precautions.  Final Clinical Impressions(s) / ED Diagnoses   Final diagnoses:  Acute right-sided low back pain with right-sided sciatica    ED Discharge Orders         Ordered    HYDROcodone-acetaminophen (NORCO) 5-325 MG tablet  Every 4 hours PRN     08/12/18 0917    cyclobenzaprine (FLEXERIL) 5 MG tablet  3 times daily PRN     08/12/18 0917    lidocaine (LIDODERM) 5 %  Every 24 hours     08/12/18 0917           Pricilla Loveless, MD 08/12/18 [phone]

## 2018-08-12 NOTE — ED Triage Notes (Signed)
Pt reports having hx of back and hip pain in the past. Since Sunday, hip pain has started to worsen, especially tonight. 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 Denies direct injury. Has taking tylenol today and expired oxycodone without relief. Denies bowel/bladder incontinence

## 2018-08-12 NOTE — ED Notes (Signed)
Patient transported to X-ray 

## 2018-08-12 NOTE — Discharge Instructions (Addendum)
If you develop worsening, recurrent, or continued back pain, numbness or weakness in the legs, incontinence of your bowels or bladders, numbness of your buttocks, fever, abdominal pain, or any other new/concerning symptoms then return to the ER for evaluation.   Do not take the hydrocodone/norco and the flexeril at the same time. Do not mix with other medications or alcohol. Do not drive or operate heavy machinery while on this medicine.

## 2018-08-13 LAB — CBC
HEMATOCRIT: 38.2 % (ref 34.0–46.6)
Hemoglobin: 13.2 g/dL (ref 11.1–15.9)
MCH: 32.3 pg (ref 26.6–33.0)
MCHC: 34.6 g/dL (ref 31.5–35.7)
MCV: 93 fL (ref 79–97)
PLATELETS: 237 10*3/uL (ref 150–450)
RBC: 4.09 x10E6/uL (ref 3.77–5.28)
RDW: 12.5 % (ref 11.7–15.4)
WBC: 4.9 10*3/uL (ref 3.4–10.8)

## 2018-08-17 ENCOUNTER — Telehealth: Payer: Self-pay

## 2018-08-17 DIAGNOSIS — E876 Hypokalemia: Secondary | ICD-10-CM

## 2018-08-17 MED ORDER — POTASSIUM CHLORIDE CRYS ER 20 MEQ PO TBCR: 20.0000 meq | EXTENDED_RELEASE_TABLET | Freq: Two times a day (BID) | ORAL | 3 refills | Status: DC

## 2018-08-17 NOTE — Telephone Encounter (Signed)
Spoke with pt. She is aware of lab results and agrees to increase her K+ to bid. Pt states she is not on lasix, only torsemide at this time. She has agreed to a repeat BMP on 2/26. She had no additional questions and has verbalized understanding.

## 2018-08-17 NOTE — Telephone Encounter (Signed)
-----   Message from Duke Salvia, MD sent at 08/14/2018  3:45 PM EST ----- Please Inform Patient  Labs are normal x low potassium   Is she taking any more of her diuretics Need to increase her K to bid with recheck in 2 weeks  Also can you clarify if she is indeed taking two different diuretics   Thanks

## 2018-09-02 ENCOUNTER — Other Ambulatory Visit: Payer: PRIVATE HEALTH INSURANCE | Admitting: *Deleted

## 2018-09-02 DIAGNOSIS — E876 Hypokalemia: Secondary | ICD-10-CM

## 2018-09-02 LAB — BASIC METABOLIC PANEL
BUN/Creatinine Ratio: 21 (ref 12–28)
BUN: 16 mg/dL (ref 8–27)
CO2: 25 mmol/L (ref 20–29)
Calcium: 9.2 mg/dL (ref 8.7–10.3)
Chloride: 97 mmol/L (ref 96–106)
Creatinine, Ser: 0.76 mg/dL (ref 0.57–1.00)
GFR calc Af Amer: 94 mL/min/{1.73_m2} (ref >59)
GFR calc non Af Amer: 81 mL/min/{1.73_m2} (ref >59)
Glucose: 101 mg/dL — ABNORMAL HIGH (ref 65–99)
Potassium: 4.3 mmol/L (ref 3.5–5.2)
SODIUM: 141 mmol/L (ref 134–144)

## 2018-09-26 ENCOUNTER — Other Ambulatory Visit: Payer: Self-pay | Admitting: Internal Medicine

## 2018-10-28 ENCOUNTER — Other Ambulatory Visit: Payer: Self-pay | Admitting: Internal Medicine

## 2018-10-28 NOTE — Telephone Encounter (Signed)
68yo, 108.9kg, Scr 0.76 on 09/02/18 Last OV on 06/30/18 Indication afib

## 2018-11-27 ENCOUNTER — Other Ambulatory Visit: Payer: Self-pay | Admitting: Internal Medicine

## 2018-11-27 NOTE — Telephone Encounter (Signed)
Pt last saw Dr Graciela Husbands 06/30/18, last labs 09/02/18 Creat 0.76, age 69, weight 108.9kg, based on specified criteria pt is on appropriate dosage of Eliquis 5mg  BID, will refill rx.

## 2019-06-17 ENCOUNTER — Encounter: Payer: Self-pay | Admitting: Internal Medicine

## 2019-06-17 ENCOUNTER — Telehealth: Payer: Self-pay | Admitting: Internal Medicine

## 2019-06-17 ENCOUNTER — Ambulatory Visit (INDEPENDENT_AMBULATORY_CARE_PROVIDER_SITE_OTHER): Payer: PRIVATE HEALTH INSURANCE | Admitting: Internal Medicine

## 2019-06-17 ENCOUNTER — Other Ambulatory Visit: Payer: Self-pay

## 2019-06-17 VITALS — BP 120/70 | HR 75 | Ht 65.0 in | Wt 285.2 lb

## 2019-06-17 DIAGNOSIS — I4892 Unspecified atrial flutter: Secondary | ICD-10-CM | POA: Diagnosis not present

## 2019-06-17 DIAGNOSIS — I10 Essential (primary) hypertension: Secondary | ICD-10-CM | POA: Diagnosis not present

## 2019-06-17 DIAGNOSIS — I480 Paroxysmal atrial fibrillation: Secondary | ICD-10-CM | POA: Diagnosis not present

## 2019-06-17 MED ORDER — DILTIAZEM HCL ER COATED BEADS 120 MG PO CP24: 120.0000 mg | ORAL_CAPSULE | Freq: Every day | ORAL | 2 refills | Status: DC | PRN

## 2019-06-17 MED ORDER — DILTIAZEM HCL 30 MG PO TABS: 30.0000 mg | ORAL_TABLET | Freq: Four times a day (QID) | ORAL | 2 refills | Status: DC | PRN

## 2019-06-17 NOTE — Telephone Encounter (Signed)
Morris faxing over a request for clarification on direction for diltiazem 120 mg 24 hr capsule. Pharmacy stating that this medication can not be spilt because it is a capsule. Pharmacy requesting clarification on directions. Please resend Rx in with clarification on directions. Please address

## 2019-06-17 NOTE — Progress Notes (Signed)
You with your home sleep study Casimiro Needle     Patient Care Team: Hillary Bow, MD as PCP - General (Internal Medicine)   HPI  Crystal Roth is a 68 y.o. female Seen in follow-up for paroxysmal atrial fibrillation for which she has taken apixoban and flecainide.     She was intolerant of acebutolol.  Resulting in fatigue.  Continue infrequent episodes of atrial fibrillation/tachypalpitations.  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  Great deal of stress. CHADS-VASc score is 3 (Gender, hypertension, diabetes.) DATE PR interval QRSduration Flecainide  6/13 202 84 0  1/18  162 102 100  11/18 146 108 150  12/19 180 110 150  12/20 202 120 150   DCCV  2017, 10/18 ;      DATE TEST EF   7/13 MYOVIEW 74% No Ischemia  10/17 Echo   55-65 % LA (41/1.7/41)        Date Cr K Hgb  10/18 0.81 3.8 14   2/20 0.76 4.3 13.2    No bleeding  Past Medical History:  Diagnosis Date  . Atrial fibrillation (HCC)    dx 5/13; Apixaban; echo 5/13: mild LVH, EF 60-65%, mod LAe, mild RAE, PASP 29  . Diabetes mellitus   . Fatty liver   . Hypertension   . kidney calculus   . Morbid obesity (HCC)   . PONV (postoperative nausea and vomiting)   . Reactive airway disease     Past Surgical History:  Procedure Laterality Date  . ABDOMINAL HYSTERECTOMY    . CARDIOVERSION  12/27/2011   Procedure: CARDIOVERSION;  Surgeon: Duke Salvia, MD;  Location: [facility] OR;  Service: Cardiovascular;  Laterality: N/A;  . CARDIOVERSION  01/17/2012   Procedure: CARDIOVERSION;  Surgeon: Duke Salvia, MD;  Location: [facility] OR;  Service: Cardiovascular;  Laterality: N/A;  . CARDIOVERSION N/A 03/15/2016   Procedure: CARDIOVERSION;  Surgeon: [name], MD;  Location: [facility] ENDOSCOPY;  Service: Cardiovascular;  Laterality: N/A;  . CARDIOVERSION N/A 05/07/2017   Procedure: CARDIOVERSION;  Surgeon: Chilton Si, MD;   Location: [facility] ENDOSCOPY;  Service: Cardiovascular;  Laterality: N/A;  . DILATION AND CURETTAGE OF UTERUS    . FOOT SURGERY    . TUBAL LIGATION      Current Outpatient Medications  Medication Sig Dispense Refill  . acetaminophen (TYLENOL) 500 MG tablet Take 500 mg by mouth every 6 (six) hours as needed. Pain     . allopurinol (ZYLOPRIM) 300 MG tablet Take 300 mg by mouth daily.    . benazepril (LOTENSIN) 20 MG tablet Take 20 mg by mouth daily.    . Cholecalciferol (VITAMIN D3) 5000 units TBDP Take by mouth.    Everlene Balls 5 MG TABS tablet Take 1 tablet by mouth twice daily 180 tablet 1  . flecainide (TAMBOCOR) 150 MG tablet TAKE 1 TABLET BY MOUTH TWICE DAILY 180 tablet 3  . lidocaine (LIDODERM) 5 % Place 1 patch onto the skin daily. Remove & Discard patch within 12 hours or as directed by MD 10 patch 0  . MAGNESIUM PO Take 400 mg by mouth 2 (two) times daily.     . metFORMIN (GLUCOPHAGE) 500 MG tablet Take by mouth 2 (two) times daily with a meal. 1 to 2 tablets twice a day    . Multiple Vitamins-Minerals (OCUVITE EXTRA PO) Take by mouth.    . potassium  chloride SA (K-DUR,KLOR-CON) 20 MEQ tablet Take 1 tablet (20 mEq total) by mouth 2 (two) times daily. 90 tablet 3  . PROVENTIL HFA 108 (90 BASE) MCG/ACT inhaler INHALE ONE TO TWO PUFFS INTO LUNGS EVERY 6 HOURS AS NEEDED FOR WHEEZING 7 each 2  . tiZANidine (ZANAFLEX) 4 MG tablet Take 4-8 mg by mouth every 8 (eight) hours as needed.    . torsemide (DEMADEX) 20 MG tablet Take 20-40 mg by mouth as directed. Take 20MG  by mouth daily, take an extra 20MG  if needed for fluid gain >3lbs    . Turmeric 500 MG CAPS Take 1 capsule by mouth daily.     No current facility-administered medications for this visit.    Allergies  Allergen Reactions  . Nsaids Shortness Of Breath  . Metoprolol Other (See Comments)    Pt gained 22 lbs in 2 weeks. Depression. Pt states in made blood sugar irregular as well as caused bradycardia      Review of Systems  negative except from HPI and PMH  Physical Exam BP 120/70   Pulse 75   Ht 5\' 5"  (1.651 m)   Wt 285 lb 3.2 oz (129.4 kg)   SpO2 97%   BMI 47.46 kg/m  Well developed and Morbidly obese in no acute distress HENT normal Neck supple with JVP-  flat   Clear Regular rate and rhythm, no murmurs or gallops Abd-soft with active BS No Clubbing cyanosis edema Skin-warm and dry A & Oriented  Grossly normal sensory and motor function  ECG sinus at 75 with a pattern of atrial bigeminy Intervals 20/12/47   Assessment and  Plan Atrial flutter/fibrillation  Flecainide therapy    Hypertension   Morbid obesity  Metoprolol intolerance   PVCs   Bradycardia    Infrequent tachypalpitations.  Probably atrial fibrillation.  Has an [employer] monitor.  Is using it.  Continue flecainide.  On Anticoagulation;  No bleeding issues   Obesity is an issue.  Weight gain is considerable, about 45 pounds.  Discussed the importance of exercise and diet.  She will think on getting an exercise bicycle.  She is taking A1c.  Standard is to have her on an adjunctive AV nodal blockade.  She has been intolerant of calcium blockers and beta-blockers.  She will use Cardizem on a as needed basis in the event of tachypalpitations.  We spent more than 50% of our >25 min visit in face to face counseling regarding the above   We spent more than 50% of our >25 min visit in face to face counseling regarding the above

## 2019-06-17 NOTE — Patient Instructions (Signed)
Medication Instructions:  Start Cardizem CD 120mg  (1/2 cap twice daily) as needed  Labwork: None ordered.   Testing/Procedures: None ordered.   Follow-Up: Your physician recommends that you schedule a follow-up appointment in: one year with Dr Caryl Comes   Any Other Special Instructions Will Be Listed Below (If Applicable).     If you need a refill on your cardiac medications before your next appointment, please call your pharmacy.

## 2019-08-23 ENCOUNTER — Other Ambulatory Visit: Payer: Self-pay | Admitting: Internal Medicine

## 2019-08-23 DIAGNOSIS — E876 Hypokalemia: Secondary | ICD-10-CM

## 2019-08-24 NOTE — Telephone Encounter (Signed)
Prescription refill request for Eliquis received.  Last office visit: 12/10/2020[name] Scr: 0.76, 09/02/2018 Age: 69 y.o. Weight: 129.4 kg   Prescription refill sent.

## 2019-09-07 ENCOUNTER — Emergency Department (HOSPITAL_COMMUNITY): Payer: PRIVATE HEALTH INSURANCE

## 2019-09-07 ENCOUNTER — Other Ambulatory Visit: Payer: Self-pay

## 2019-09-07 ENCOUNTER — Emergency Department (HOSPITAL_COMMUNITY)
Admission: EM | Admit: 2019-09-07 | Discharge: 2019-09-07 | Disposition: A | Discharge status: Home / Self Care | Payer: PRIVATE HEALTH INSURANCE | Attending: Emergency Medicine | Admitting: Emergency Medicine

## 2019-09-07 ENCOUNTER — Encounter (HOSPITAL_COMMUNITY): Payer: Self-pay | Admitting: Emergency Medicine

## 2019-09-07 DIAGNOSIS — J45909 Unspecified asthma, uncomplicated: Secondary | ICD-10-CM | POA: Insufficient documentation

## 2019-09-07 DIAGNOSIS — S299XXA Unspecified injury of thorax, initial encounter: Secondary | ICD-10-CM | POA: Diagnosis present

## 2019-09-07 DIAGNOSIS — E119 Type 2 diabetes mellitus without complications: Secondary | ICD-10-CM | POA: Insufficient documentation

## 2019-09-07 DIAGNOSIS — Z79899 Other long term (current) drug therapy: Secondary | ICD-10-CM | POA: Diagnosis not present

## 2019-09-07 DIAGNOSIS — Z7901 Long term (current) use of anticoagulants: Secondary | ICD-10-CM | POA: Diagnosis not present

## 2019-09-07 DIAGNOSIS — W108XXA Fall (on) (from) other stairs and steps, initial encounter: Secondary | ICD-10-CM | POA: Insufficient documentation

## 2019-09-07 DIAGNOSIS — Z7984 Long term (current) use of oral hypoglycemic drugs: Secondary | ICD-10-CM | POA: Insufficient documentation

## 2019-09-07 DIAGNOSIS — Y939 Activity, unspecified: Secondary | ICD-10-CM | POA: Diagnosis not present

## 2019-09-07 DIAGNOSIS — S3992XA Unspecified injury of lower back, initial encounter: Secondary | ICD-10-CM

## 2019-09-07 DIAGNOSIS — Y92019 Unspecified place in single-family (private) house as the place of occurrence of the external cause: Secondary | ICD-10-CM | POA: Diagnosis not present

## 2019-09-07 DIAGNOSIS — Y999 Unspecified external cause status: Secondary | ICD-10-CM | POA: Diagnosis not present

## 2019-09-07 DIAGNOSIS — I10 Essential (primary) hypertension: Secondary | ICD-10-CM | POA: Diagnosis not present

## 2019-09-07 NOTE — ED Triage Notes (Signed)
Tripped and fell on Sun night.  Pain between shoulder blades, under RT breast and pain everywhere when she sneezes or coughs.

## 2019-09-07 NOTE — Discharge Instructions (Addendum)
Continue your current treatments at home, and use heat on the sore area.  See your doctor or return here as needed for problems.

## 2019-09-07 NOTE — ED Provider Notes (Signed)
[facility] EMERGENCY DEPARTMENT Provider Note   CSN: [id number] Arrival date & time: 09/07/19  1544     History Chief Complaint  Patient presents with  . Fall    Crystal Roth is a 69 y.o. female.  HPI She presents for injuries from fall, 2 days ago when she was walking into her son's home, in [place], [place].  She had just given some packages to another family member when she stumbled backwards, several steps then fell striking her back, sidewalk, and her head on the grass.  She was able to get up off the ground and sit on a chair, then walked inside, checked her vital signs, and went to the bathroom.  Her vital signs were normal.  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  She took tizanidine and has been using lidocaine patches, with improvement of her pain.  She is concerned because she has persistent pain between her shoulder blades, and on her right scapula.  She denies current headache, neck pain, lower back pain, difficulty walking, or change in chronic leg pain.  She could not work yesterday or today because of the pain which is worst when she is extending her back, less when she flexes her back, and the pain is always located in the mid thorax region.  There are no other known modifying factors.    Past Medical History:  Diagnosis Date  . Atrial fibrillation (HCC)    dx 5/13; Apixaban; echo 5/13: mild LVH, EF 60-65%, mod LAe, mild RAE, PASP 29  . Diabetes mellitus   . Fatty liver   . Hypertension   . kidney calculus   . Morbid obesity (HCC)   . PONV (postoperative nausea and vomiting)   . Reactive airway disease     Patient Active Problem List   Diagnosis Date Noted  . Essential hypertension, benign 03/14/2013  . Hyperlipidemia LDL goal <100 03/14/2013  . Diabetes (HCC) 03/14/2013  . Morbid obesity (HCC) 03/14/2013  . Atrial fibrillation 11/06/2011  . Preop cardiovascular exam 11/06/2011    Past Surgical History:  Procedure Laterality  Date  . ABDOMINAL HYSTERECTOMY    . CARDIOVERSION  12/27/2011   Procedure: CARDIOVERSION;  Surgeon: Duke Salvia, MD;  Location: [facility] OR;  Service: Cardiovascular;  Laterality: N/A;  . CARDIOVERSION  01/17/2012   Procedure: CARDIOVERSION;  Surgeon: Duke Salvia, MD;  Location: [facility] OR;  Service: Cardiovascular;  Laterality: N/A;  . CARDIOVERSION N/A 03/15/2016   Procedure: CARDIOVERSION;  Surgeon: [name], MD;  Location: [facility] ENDOSCOPY;  Service: Cardiovascular;  Laterality: N/A;  . CARDIOVERSION N/A 05/07/2017   Procedure: CARDIOVERSION;  Surgeon: Chilton Si, MD;  Location: [facility] ENDOSCOPY;  Service: Cardiovascular;  Laterality: N/A;  . DILATION AND CURETTAGE OF UTERUS    . FOOT SURGERY    . TUBAL LIGATION       OB History   No obstetric history on file.     Family History  Problem Relation Age of Onset  . Parkinsonism Mother   . Osteoarthritis Father     Social History   Tobacco Use  . Smoking status: Never Smoker  . Smokeless tobacco: Never Used  Substance Use Topics  . Alcohol use: No  . Drug use: No    Home Medications Prior to Admission medications   Medication Sig Start Date End Date Taking? Authorizing Provider  acetaminophen (TYLENOL) 500 MG tablet Take 500 mg by mouth every 6 (six) hours as needed. Pain  [provider]  allopurinol (ZYLOPRIM) 300 MG tablet Take 300 mg by mouth daily.    [provider]  benazepril (LOTENSIN) 20 MG tablet Take 20 mg by mouth daily.    [provider]  Cholecalciferol (VITAMIN D3) 5000 units TBDP Take by mouth.    [provider]  diltiazem (CARDIZEM) 30 MG tablet Take 1 tablet (30 mg total) by mouth every 6 (six) hours as needed (up to 3 doses a day for heart paplitations). 06/17/19   Duke Salvia, MD  ELIQUIS 5 MG TABS tablet Take 1 tablet by mouth twice daily 08/24/19   Duke Salvia, MD  flecainide [facility]) 150 MG tablet Take 1 tablet by mouth twice daily 08/24/19   Duke Salvia, MD  lidocaine (LIDODERM) 5 % Place 1 patch onto the skin daily. Remove & Discard patch within 12 hours or as directed by MD 08/12/18   Pricilla Loveless, MD  MAGNESIUM PO Take 400 mg by mouth 2 (two) times daily.     [provider]  metFORMIN (GLUCOPHAGE) 500 MG tablet Take by mouth 2 (two) times daily with a meal. 1 to 2 tablets twice a day    [provider]  Multiple Vitamins-Minerals (OCUVITE EXTRA PO) Take by mouth.    [provider]  potassium chloride SA (KLOR-CON) 20 MEQ tablet Take 1 tablet (20 mEq total) by mouth daily. 08/24/19   Duke Salvia, MD  PROVENTIL HFA 108 (90 BASE) MCG/ACT inhaler INHALE ONE TO TWO PUFFS INTO LUNGS EVERY 6 HOURS AS NEEDED FOR WHEEZING 05/23/14   Luking, Jonna Coup, MD  tiZANidine (ZANAFLEX) 4 MG tablet Take 4-8 mg by mouth every 8 (eight) hours as needed. 06/01/19   [provider]  torsemide (DEMADEX) 20 MG tablet Take 20-40 mg by mouth as directed. Take 20MG  by mouth daily, take an extra 20MG  if needed for fluid gain >3lbs    [provider]  Turmeric 500 MG CAPS Take 1 capsule by mouth daily.    [provider]    Allergies    Nsaids and Metoprolol  Review of Systems   Review of Systems  All other systems reviewed and are negative.   Physical Exam Updated Vital Signs BP (!) 151/71 (BP Location: Right Wrist)   Pulse 76   Temp 98.5 F (36.9 C) (Oral)   Resp 17   Ht 5\' 5"  (1.651 m)   Wt 136.1 kg   SpO2 100%   BMI 49.92 kg/m   Physical Exam Vitals and nursing note reviewed.  Constitutional:      General: She is not in acute distress.    Appearance: She is well-developed. She is obese. She is not ill-appearing, toxic-appearing or diaphoretic.  HENT:     Head: Normocephalic and atraumatic.  Eyes:     Conjunctiva/sclera: Conjunctivae normal.     Pupils: Pupils are equal, round, and reactive to light.  Neck:     Trachea: Phonation normal.  Cardiovascular:     Rate and Rhythm:  Normal rate.  Pulmonary:     Effort: Pulmonary effort is normal.  Chest:     Chest wall: No tenderness.  Abdominal:     General: There is no distension.  Musculoskeletal:        General: Normal range of motion.     Cervical back: Normal range of motion and neck supple.     Comments: Mild tender mid upper back, approximately T6 region, without palpable deformity or crepitation.  There is no significant tenderness of either shoulder blade.  She has normal range of motion of her extremities, without pain.  Skin:    General: Skin is warm and dry.  Neurological:     Mental Status: She is alert and oriented to person, place, and time.     Motor: No abnormal muscle tone.  Psychiatric:        Mood and Affect: Mood normal.        Behavior: Behavior normal.        Thought Content: Thought content normal.        Judgment: Judgment normal.     ED Results / Procedures / Treatments   Labs (all labs ordered are listed, but only abnormal results are displayed) Labs Reviewed - No data to display  EKG None  Radiology DG Thoracic Spine 2 View  Result Date: 09/07/2019 CLINICAL DATA:  Upper back pain since a fall 2 days ago. EXAM: THORACIC SPINE 2 VIEWS COMPARISON:  None. FINDINGS: There is no evidence of thoracic spine fracture. Accentuation of the thoracic kyphosis. Anterior osteophytes fuse multiple levels in the thoracic spine. Incidental note is made of aortic atherosclerosis. IMPRESSION: No acute abnormality of the thoracic spine. Aortic Atherosclerosis (ICD10-I70.0). Electronically Signed   By: Francene Boyers M.D.   On: 09/07/2019 17:47    Procedures Procedures (including critical care time)  Medications Ordered in ED Medications - No data to display  ED Course  I have reviewed the triage vital signs and the nursing notes.  Pertinent labs & imaging results that were available during my care of the patient were reviewed by me and considered in my medical decision making (see chart for  details).    MDM Rules/Calculators/A&P                       Patient Vitals for the past 24 hrs:  BP Temp Temp src Pulse Resp SpO2 Height Weight  09/07/19 1858 (!) 151/71 - - 76 17 100 % - -  09/07/19 1552 (!) 154/75 98.5 F (36.9 C) Oral 71 19 100 % - -  09/07/19 1550 - - - - - - 5\' 5"  (1.651 m) 136.1 kg    At D/C- Reevaluation with update and discussion. After initial assessment and treatment, an updated evaluation reveals she remains comfortable has no further complaints.  Findings discussed and questions answered.   Medical Decision Making: Fall after she stumbled backwards, while walking in an unfamiliar area.  Injury 2 days ago.  Isolated symptoms upper back, imaging negative.  Doubt spinal myelopathy, occult fracture or radiculopathy.  DEONNA KRUMMEL was evaluated in Emergency Department on 09/07/2019 for the symptoms described in the history of present illness. She was evaluated in the context of the global COVID-19 pandemic, which necessitated consideration that the patient might be at risk for infection with the SARS-CoV-2 virus that causes COVID-19. Institutional protocols and algorithms that pertain to the evaluation of patients at risk for COVID-19 are in a state of rapid change based on information released by regulatory bodies including the CDC and federal and state organizations. These policies and algorithms were followed during the patient's care in the ED.   CRITICAL CARE-no Performed by: 11/07/2019   Nursing Notes Reviewed/ Care Coordinated Applicable Imaging Reviewed Interpretation of Laboratory Data incorporated into ED treatment  The patient appears reasonably screened and/or stabilized for discharge and I doubt any other medical condition or other [facility] requiring further screening, evaluation,  or treatment in the ED at this time prior to discharge.  Plan: Home Medications-continue usual medications; Home Treatments-rest, heat, gradual advance  activity; return here if the recommended treatment, does not improve the symptoms; Recommended follow up-PCP, as needed    Final Clinical Impression(s) / ED Diagnoses Final diagnoses:  Injury of back, initial encounter    Rx / DC Orders ED Discharge Orders    None       Daleen Bo, MD 09/07/19 2140

## 2019-09-15 ENCOUNTER — Ambulatory Visit
Admission: EM | Admit: 2019-09-15 | Discharge: 2019-09-15 | Disposition: A | Discharge status: Home / Self Care | Payer: PRIVATE HEALTH INSURANCE

## 2019-09-15 ENCOUNTER — Other Ambulatory Visit: Payer: Self-pay

## 2019-09-15 DIAGNOSIS — Z7689 Persons encountering health services in other specified circumstances: Secondary | ICD-10-CM

## 2019-09-15 DIAGNOSIS — M546 Pain in thoracic spine: Secondary | ICD-10-CM

## 2019-09-15 NOTE — ED Triage Notes (Signed)
Pt presents to UC for return to work note. Pt states she fell a few days ago and was seen in ER. Pt states she is doing better.

## 2019-09-15 NOTE — Discharge Instructions (Signed)
Work note given Continue conservative management of rest, ice, and gentle stretches Continue with OTC medications as needed Follow up with PCP as needed Return or go to the ER if you have any new or worsening symptoms (fever, chills, chest pain, abdominal pain, changes in bowel or bladder habits, pain radiating into lower legs, etc...)

## 2019-09-15 NOTE — ED Provider Notes (Signed)
[facility] CARE CENTER   [id number] 09/15/19 Arrival Time: 1107  CC: Return to work note  SUBJECTIVE: History from: patient. Crystal Roth is a 69 y.o. female presents for return to work note.  Had a fall 9 days ago.  Injured mid back.  Was seen in the ED at that time.  X-rays negative.  Conservative measures recommended.  States symptoms are resolved.  Would like to return to work full duty without restrictions.  Denies fever, chills, headache, vision changes, erythema, ecchymosis, effusion, weakness, numbness and tingling, saddle paresthesias, loss of bowel or bladder function.      ROS: As per HPI.  All other pertinent ROS negative.     Past Medical History:  Diagnosis Date  . Atrial fibrillation (HCC)    dx 5/13; Apixaban; echo 5/13: mild LVH, EF 60-65%, mod LAe, mild RAE, PASP 29  . Diabetes mellitus   . Fatty liver   . Hypertension   . kidney calculus   . Morbid obesity (HCC)   . PONV (postoperative nausea and vomiting)   . Reactive airway disease    Past Surgical History:  Procedure Laterality Date  . ABDOMINAL HYSTERECTOMY    . CARDIOVERSION  12/27/2011   Procedure: CARDIOVERSION;  Surgeon: Duke Salvia, MD;  Location: [facility] OR;  Service: Cardiovascular;  Laterality: N/A;  . CARDIOVERSION  01/17/2012   Procedure: CARDIOVERSION;  Surgeon: Duke Salvia, MD;  Location: [facility] OR;  Service: Cardiovascular;  Laterality: N/A;  . CARDIOVERSION N/A 03/15/2016   Procedure: CARDIOVERSION;  Surgeon: [name], MD;  Location: [facility] ENDOSCOPY;  Service: Cardiovascular;  Laterality: N/A;  . CARDIOVERSION N/A 05/07/2017   Procedure: CARDIOVERSION;  Surgeon: Chilton Si, MD;  Location: [facility] ENDOSCOPY;  Service: Cardiovascular;  Laterality: N/A;  . DILATION AND CURETTAGE OF UTERUS    . FOOT SURGERY    . TUBAL LIGATION     Allergies  Allergen Reactions  . Nsaids Shortness Of Breath  . Metoprolol Other (See Comments)    Pt gained 22 lbs in 2 weeks. Depression. Pt states in made blood  sugar irregular as well as caused bradycardia   No current facility-administered medications on file prior to encounter.   Current Outpatient Medications on File Prior to Encounter  Medication Sig Dispense Refill  . acetaminophen (TYLENOL) 500 MG tablet Take 500 mg by mouth every 6 (six) hours as needed. Pain     . allopurinol (ZYLOPRIM) 300 MG tablet Take 300 mg by mouth daily.    . benazepril (LOTENSIN) 20 MG tablet Take 20 mg by mouth daily.    . Cholecalciferol (VITAMIN D3) 5000 units TBDP Take by mouth.    . diltiazem (CARDIZEM) 30 MG tablet Take 1 tablet (30 mg total) by mouth every 6 (six) hours as needed (up to 3 doses a day for heart paplitations). 150 tablet 2  . ELIQUIS 5 MG TABS tablet Take 1 tablet by mouth twice daily 180 tablet 1  . flecainide (TAMBOCOR) 150 MG tablet Take 1 tablet by mouth twice daily 180 tablet 2  . lidocaine (LIDODERM) 5 % Place 1 patch onto the skin daily. Remove & Discard patch within 12 hours or as directed by MD 10 patch 0  . MAGNESIUM PO Take 400 mg by mouth 2 (two) times daily.     . metFORMIN (GLUCOPHAGE) 500 MG tablet Take by mouth 2 (two) times daily with a meal. 1 to 2 tablets twice a day    . Multiple Vitamins-Minerals (OCUVITE EXTRA PO) Take  by mouth.    . potassium chloride SA (KLOR-CON) 20 MEQ tablet Take 1 tablet (20 mEq total) by mouth daily. 90 tablet 2  . PROVENTIL HFA 108 (90 BASE) MCG/ACT inhaler INHALE ONE TO TWO PUFFS INTO LUNGS EVERY 6 HOURS AS NEEDED FOR WHEEZING 7 each 2  . tiZANidine (ZANAFLEX) 4 MG tablet Take 4-8 mg by mouth every 8 (eight) hours as needed.    . torsemide (DEMADEX) 20 MG tablet Take 20-40 mg by mouth as directed. Take 20MG  by mouth daily, take an extra 20MG  if needed for fluid gain >3lbs    . Turmeric 500 MG CAPS Take 1 capsule by mouth daily.     Social History   Socioeconomic History  . Marital status: Married    Spouse name: Not on file  . Number of children: Not on file  . Years of education: Not on file   . Highest education level: Not on file  Occupational History  . Not on file  Tobacco Use  . Smoking status: Never Smoker  . Smokeless tobacco: Never Used  Substance and Sexual Activity  . Alcohol use: No  . Drug use: No  . Sexual activity: Not on file  Other Topics Concern  . Not on file  Social History Narrative  . Not on file   Social Determinants of Health   Financial Resource Strain:   . Difficulty of Paying Living Expenses: Not on file  Food Insecurity:   . Worried About Charity fundraiser in the Last Year: Not on file  . Ran Out of Food in the Last Year: Not on file  Transportation Needs:   . Lack of Transportation (Medical): Not on file  . Lack of Transportation (Non-Medical): Not on file  Physical Activity:   . Days of Exercise per Week: Not on file  . Minutes of Exercise per Session: Not on file  Stress:   . Feeling of Stress : Not on file  Social Connections:   . Frequency of Communication with Friends and Family: Not on file  . Frequency of Social Gatherings with Friends and Family: Not on file  . Attends Religious Services: Not on file  . Active Member of Clubs or Organizations: Not on file  . Attends Archivist Meetings: Not on file  . Marital Status: Not on file  Intimate Partner Violence:   . Fear of Current or Ex-Partner: Not on file  . Emotionally Abused: Not on file  . Physically Abused: Not on file  . Sexually Abused: Not on file   Family History  Problem Relation Age of Onset  . Parkinsonism Mother   . Osteoarthritis Father     OBJECTIVE:  Vitals:   09/15/19 1122  BP: (!) 175/76  Pulse: 79  Resp: 16  Temp: 98.6 F (37 C)  TempSrc: Oral  SpO2: 96%    General appearance: ALERT; in no acute distress.  Head: NCAT ENT: PERRL, EOMI grossly; nares patent; oropharynx clear Lungs: Normal respiratory effort; CTAB CV: RRR Skin: warm and dry Neurologic: Ambulates without difficulty; CN 2-12 intact grossly; Sensation intact about  the upper/ lower extremities; Strength intact about the upper and lower extremities Psychological: alert and cooperative; normal mood and affect   ASSESSMENT & PLAN:  1. Acute thoracic back pain, unspecified back pain laterality   2. Return to work evaluation    Work note given Continue conservative management of rest, ice, and gentle stretches Continue with OTC medications as needed Follow up with  PCP as needed Return or go to the ER if you have any new or worsening symptoms (fever, chills, chest pain, abdominal pain, changes in bowel or bladder habits, pain radiating into lower legs, etc...)   Reviewed expectations re: course of current medical issues. Questions answered. Outlined signs and symptoms indicating need for more acute intervention. Patient verbalized understanding. After Visit Summary given.    Rennis Harding, PA-C 09/15/19 1152

## 2020-01-11 ENCOUNTER — Other Ambulatory Visit: Payer: Self-pay

## 2020-01-11 ENCOUNTER — Ambulatory Visit (INDEPENDENT_AMBULATORY_CARE_PROVIDER_SITE_OTHER): Payer: PRIVATE HEALTH INSURANCE

## 2020-01-11 ENCOUNTER — Ambulatory Visit
Admission: EM | Admit: 2020-01-11 | Discharge: 2020-01-11 | Disposition: A | Discharge status: Home / Self Care | Payer: PRIVATE HEALTH INSURANCE | Attending: Emergency Medicine | Admitting: Emergency Medicine

## 2020-01-11 ENCOUNTER — Encounter: Payer: Self-pay | Admitting: Emergency Medicine

## 2020-01-11 DIAGNOSIS — S99921A Unspecified injury of right foot, initial encounter: Secondary | ICD-10-CM

## 2020-01-11 DIAGNOSIS — L03031 Cellulitis of right toe: Secondary | ICD-10-CM

## 2020-01-11 DIAGNOSIS — M79674 Pain in right toe(s): Secondary | ICD-10-CM

## 2020-01-11 DIAGNOSIS — M7989 Other specified soft tissue disorders: Secondary | ICD-10-CM

## 2020-01-11 MED ORDER — DOXYCYCLINE HYCLATE 100 MG PO CAPS: 100.0000 mg | ORAL_CAPSULE | Freq: Two times a day (BID) | ORAL | 0 refills | Status: DC

## 2020-01-11 NOTE — Discharge Instructions (Signed)
X-rays negative for acute fracture or dislocation Continue conservative management of rest, ice, and elevation Doxycycline prescribed for possible cellulitis of the foot Take as directed and to completion Follow up with PCP if symptoms persist Return or go to the ER if you have any new or worsening symptoms (fever, chills, chest pain, redness, swelling, bruising, deformity, worsening symptoms despite treatment, etc...)

## 2020-01-11 NOTE — ED Provider Notes (Signed)
[facility]   [id number] 01/11/20 Arrival Time: 1400  CC: RT foot pain  SUBJECTIVE: History from: patient. RAVYNN HOGATE is a 69 y.o. female complains of RT foot pain and swelling x 3-4 weeks.  Initial injury occurred when shampoo bottle fell on toe.  Then jammed toe into suitcase recently.  Localizes the pain to the third toe.  Describes the pain as intermittent and achy in character.  Has tried OTC medications without relief.  Symptoms are made worse with walking.  Denies similar symptoms in the past.  Complains of associated swelling.  Denies fever, chills, nausea, vomiting, erythema, ecchymosis, weakness, numbness and tingling.      ROS: As per HPI.  All other pertinent ROS negative.     Past Medical History:  Diagnosis Date  . Atrial fibrillation (HCC)    dx 5/13; Apixaban; echo 5/13: mild LVH, EF 60-65%, mod LAe, mild RAE, PASP 29  . Diabetes mellitus   . Fatty liver   . Hypertension   . kidney calculus   . Morbid obesity (HCC)   . PONV (postoperative nausea and vomiting)   . Reactive airway disease    Past Surgical History:  Procedure Laterality Date  . ABDOMINAL HYSTERECTOMY    . CARDIOVERSION  12/27/2011   Procedure: CARDIOVERSION;  Surgeon: Duke Salvia, MD;  Location: [facility] OR;  Service: Cardiovascular;  Laterality: N/A;  . CARDIOVERSION  01/17/2012   Procedure: CARDIOVERSION;  Surgeon: Duke Salvia, MD;  Location: [facility] OR;  Service: Cardiovascular;  Laterality: N/A;  . CARDIOVERSION N/A 03/15/2016   Procedure: CARDIOVERSION;  Surgeon: [name], MD;  Location: [facility] ENDOSCOPY;  Service: Cardiovascular;  Laterality: N/A;  . CARDIOVERSION N/A 05/07/2017   Procedure: CARDIOVERSION;  Surgeon: Chilton Si, MD;  Location: [facility] ENDOSCOPY;  Service: Cardiovascular;  Laterality: N/A;  . DILATION AND CURETTAGE OF UTERUS    . FOOT SURGERY    . TUBAL LIGATION     Allergies  Allergen Reactions  . Nsaids Shortness Of Breath  . Metoprolol Other (See Comments)     Pt gained 22 lbs in 2 weeks. Depression. Pt states in made blood sugar irregular as well as caused bradycardia   No current facility-administered medications on file prior to encounter.   Current Outpatient Medications on File Prior to Encounter  Medication Sig Dispense Refill  . acetaminophen (TYLENOL) 500 MG tablet Take 500 mg by mouth every 6 (six) hours as needed. Pain     . allopurinol (ZYLOPRIM) 300 MG tablet Take 300 mg by mouth daily.    . benazepril (LOTENSIN) 20 MG tablet Take 20 mg by mouth daily.    . Cholecalciferol (VITAMIN D3) 5000 units TBDP Take by mouth.    . diltiazem (CARDIZEM) 30 MG tablet Take 1 tablet (30 mg total) by mouth every 6 (six) hours as needed (up to 3 doses a day for heart paplitations). 150 tablet 2  . ELIQUIS 5 MG TABS tablet Take 1 tablet by mouth twice daily 180 tablet 1  . flecainide (TAMBOCOR) 150 MG tablet Take 1 tablet by mouth twice daily 180 tablet 2  . lidocaine (LIDODERM) 5 % Place 1 patch onto the skin daily. Remove & Discard patch within 12 hours or as directed by MD 10 patch 0  . MAGNESIUM PO Take 400 mg by mouth 2 (two) times daily.     . metFORMIN (GLUCOPHAGE) 500 MG tablet Take by mouth 2 (two) times daily with a meal. 1 to 2 tablets twice a  day    . Multiple Vitamins-Minerals (OCUVITE EXTRA PO) Take by mouth.    . potassium chloride SA (KLOR-CON) 20 MEQ tablet Take 1 tablet (20 mEq total) by mouth daily. 90 tablet 2  . PROVENTIL HFA 108 (90 BASE) MCG/ACT inhaler INHALE ONE TO TWO PUFFS INTO LUNGS EVERY 6 HOURS AS NEEDED FOR WHEEZING 7 each 2  . tiZANidine (ZANAFLEX) 4 MG tablet Take 4-8 mg by mouth every 8 (eight) hours as needed.    . torsemide (DEMADEX) 20 MG tablet Take 20-40 mg by mouth as directed. Take 20MG  by mouth daily, take an extra 20MG  if needed for fluid gain >3lbs    . Turmeric 500 MG CAPS Take 1 capsule by mouth daily.     Social History   Socioeconomic History  . Marital status: Married    Spouse name: Not on file  .  Number of children: Not on file  . Years of education: Not on file  . Highest education level: Not on file  Occupational History  . Not on file  Tobacco Use  . Smoking status: Never Smoker  . Smokeless tobacco: Never Used  Substance and Sexual Activity  . Alcohol use: No  . Drug use: No  . Sexual activity: Not on file  Other Topics Concern  . Not on file  Social History Narrative  . Not on file   Social Determinants of Health   Financial Resource Strain:   . Difficulty of Paying Living Expenses:   Food Insecurity:   . Worried About in the Last Year:   . in the Last Year:   Transportation Needs:   . Programme researcher, broadcasting/film/video (Medical):   Barista Lack of Transportation (Non-Medical):   Physical Activity:   . Days of Exercise per Week:   . Minutes of Exercise per Session:   Stress:   . Feeling of Stress :   Social Connections:   . Frequency of Communication with Friends and Family:   . Frequency of Social Gatherings with Friends and Family:   . Attends Religious Services:   . Active Member of Clubs or Organizations:   . Attends Freight forwarder Meetings:   Marland Kitchen Marital Status:   Intimate Partner Violence:   . Fear of Current or Ex-Partner:   . Emotionally Abused:   Banker Physically Abused:   . Sexually Abused:    Family History  Problem Relation Age of Onset  . Parkinsonism Mother   . Osteoarthritis Father     OBJECTIVE:  Vitals:   01/11/20 1409  BP: (!) 183/87  Pulse: 85  Resp: 17  Temp: 98.6 F (37 C)  TempSrc: Oral  SpO2: 96%    General appearance: ALERT; in no acute distress.  Head: NCAT Lungs: Normal respiratory effort CV: Dorsalis pedis pulse 2+ . Cap refill < 2 seconds Musculoskeletal: RT foot Inspection: Third digit with swelling; no obvious open sores, wounds, bleeding, or drainage Palpation: 3rd digit diffusely TTP  ROM: FROM active and passive Strength: intact Skin: warm and dry Neurologic: Ambulates without  difficulty; Sensation intact about the lower extremities Psychological: alert and cooperative; normal mood and affect  DIAGNOSTIC STUDIES:  DG Foot Complete Right  Result Date: 01/11/2020 CLINICAL DATA:  Injury. EXAM: RIGHT FOOT COMPLETE - 3+ VIEW COMPARISON:  No recent. FINDINGS: Severe degenerative changes noted about the ankle and foot. No evidence of acute fracture or dislocation. No radiopaque foreign body. Peripheral vascular calcification cannot be excluded. IMPRESSION:  Diffuse severe degenerative changes noted about the right ankle and foot. No acute bony abnormality identified. Electronically Signed   By: Maisie Fus  Register   On: 01/11/2020 14:31    X-rays negative for bony abnormalities including fracture, or dislocation.  No soft tissue swelling.    I have reviewed the x-rays myself and the radiologist interpretation. I am in agreement with the radiologist interpretation.     ASSESSMENT & PLAN:  1. Pain and swelling of toe of right foot   2. Cellulitis of toe of right foot     Meds ordered this encounter  Medications  . doxycycline (VIBRAMYCIN) 100 MG capsule    Sig: Take 1 capsule (100 mg total) by mouth 2 (two) times daily.    Dispense:  20 capsule    Refill:  0    Order Specific Question:   Supervising Provider    Answer:   Eustace Moore [[id number]]   X-rays negative for acute fracture or dislocation Continue conservative management of rest, ice, and elevation Doxycycline prescribed for possible cellulitis of the foot Take as directed and to completion Follow up with PCP if symptoms persist Return or go to the ER if you have any new or worsening symptoms (fever, chills, chest pain, redness, swelling, bruising, deformity, worsening symptoms despite treatment, etc...)    Reviewed expectations re: course of current medical issues. Questions answered. Outlined signs and symptoms indicating need for more acute intervention. Patient verbalized understanding. After Visit  Summary given.    Rennis Harding, PA-C 01/11/20 1439

## 2020-03-20 ENCOUNTER — Other Ambulatory Visit: Payer: Self-pay | Admitting: Internal Medicine

## 2020-03-20 DIAGNOSIS — I480 Paroxysmal atrial fibrillation: Secondary | ICD-10-CM

## 2020-03-20 DIAGNOSIS — I4892 Unspecified atrial flutter: Secondary | ICD-10-CM

## 2020-03-20 DIAGNOSIS — Z5181 Encounter for therapeutic drug level monitoring: Secondary | ICD-10-CM

## 2020-03-20 NOTE — Telephone Encounter (Signed)
Pt last saw Dr Graciela Husbands 06/17/19, last labs 09/02/18 Creat 0.76, pt is overdue for labwork.  Will call pt to schedule labwork.  Age 69, weight 136.1kg, based on specified criteria pt is on appropriate dosage of Eliquis 5mg  BID.

## 2020-03-20 NOTE — Telephone Encounter (Signed)
Called spoke with pt, scheduled pt for CBC and BMP tomorrow 03/21/20.  Pt states she took her last dosage of Eliquis this am, needs refill ASAP.  Will refill rx x 1 and await labwork results for additional refills.

## 2020-03-21 ENCOUNTER — Other Ambulatory Visit: Payer: Self-pay

## 2020-03-21 ENCOUNTER — Other Ambulatory Visit: Payer: PRIVATE HEALTH INSURANCE

## 2020-03-21 DIAGNOSIS — Z5181 Encounter for therapeutic drug level monitoring: Secondary | ICD-10-CM

## 2020-03-21 DIAGNOSIS — I480 Paroxysmal atrial fibrillation: Secondary | ICD-10-CM

## 2020-03-21 DIAGNOSIS — I4892 Unspecified atrial flutter: Secondary | ICD-10-CM

## 2020-03-21 LAB — BASIC METABOLIC PANEL
BUN/Creatinine Ratio: 21 (ref 12–28)
BUN: 17 mg/dL (ref 8–27)
CO2: 28 mmol/L (ref 20–29)
Calcium: 9.6 mg/dL (ref 8.7–10.3)
Chloride: 97 mmol/L (ref 96–106)
Creatinine, Ser: 0.8 mg/dL (ref 0.57–1.00)
GFR calc Af Amer: 88 mL/min/{1.73_m2} (ref >59)
GFR calc non Af Amer: 76 mL/min/{1.73_m2} (ref >59)
Glucose: 172 mg/dL — ABNORMAL HIGH (ref 65–99)
Potassium: 4.1 mmol/L (ref 3.5–5.2)
Sodium: 140 mmol/L (ref 134–144)

## 2020-03-21 LAB — CBC
Hematocrit: 41.1 % (ref 34.0–46.6)
Hemoglobin: 14 g/dL (ref 11.1–15.9)
MCH: 32.3 pg (ref 26.6–33.0)
MCHC: 34.1 g/dL (ref 31.5–35.7)
MCV: 95 fL (ref 79–97)
Platelets: 227 10*3/uL (ref 150–450)
RBC: 4.33 x10E6/uL (ref 3.77–5.28)
RDW: 12.5 % (ref 11.7–15.4)
WBC: 5 10*3/uL (ref 3.4–10.8)

## 2020-06-12 ENCOUNTER — Other Ambulatory Visit: Payer: Self-pay | Admitting: Internal Medicine

## 2020-06-12 DIAGNOSIS — E876 Hypokalemia: Secondary | ICD-10-CM

## 2020-06-24 ENCOUNTER — Other Ambulatory Visit: Payer: Self-pay | Admitting: Internal Medicine

## 2020-06-26 NOTE — Telephone Encounter (Signed)
Pt last saw Dr Graciela Husbands 06/17/19, pt is overdue for follow-up. Will send message to schedulers to schedule follow-up appt with Dr Graciela Husbands, will also make note on refill pt is overdue for follow-up and needs OV for future refills. Last labs 03/21/20 Creat 0.80, age 68, weight 136.1kg, based on specified criteria pt is on appropriate dosage of Eliquis 5mg  BID.  Will refill rx x 1 90 day supply to give pt time to schedule OV with Dr .

## 2020-08-08 ENCOUNTER — Other Ambulatory Visit: Payer: Self-pay | Admitting: Internal Medicine

## 2020-08-08 ENCOUNTER — Telehealth: Payer: Self-pay | Admitting: Internal Medicine

## 2020-08-08 MED ORDER — FLECAINIDE ACETATE 150 MG PO TABS: 150.0000 mg | ORAL_TABLET | Freq: Two times a day (BID) | ORAL | 0 refills | Status: DC

## 2020-08-08 NOTE — Addendum Note (Signed)
Addended by: Oleta Mouse on: 08/08/2020 04:28 PM   Modules accepted: Orders

## 2020-08-08 NOTE — Telephone Encounter (Signed)
*  STAT* If patient is at the pharmacy, call can be transferred to refill team.   1. Which medications need to be refilled? (please list name of each medication and dose if known) flecainide (TAMBOCOR) 150 MG tablet  2. Which pharmacy/location (including street and city if local pharmacy) is medication to be sent to? [id number] - [place], [place] - [address]  3. Do they need a 30 day or 90 day supply? 90 day   Patient is out of medication.

## 2020-09-19 DIAGNOSIS — I493 Ventricular premature depolarization: Secondary | ICD-10-CM | POA: Insufficient documentation

## 2020-10-02 ENCOUNTER — Encounter: Payer: Self-pay | Admitting: Internal Medicine

## 2020-10-02 ENCOUNTER — Other Ambulatory Visit: Payer: Self-pay

## 2020-10-02 ENCOUNTER — Ambulatory Visit (INDEPENDENT_AMBULATORY_CARE_PROVIDER_SITE_OTHER): Payer: PRIVATE HEALTH INSURANCE | Admitting: Internal Medicine

## 2020-10-02 VITALS — BP 132/92 | HR 81 | Ht 64.0 in | Wt 259.0 lb

## 2020-10-02 DIAGNOSIS — I480 Paroxysmal atrial fibrillation: Secondary | ICD-10-CM

## 2020-10-02 DIAGNOSIS — I493 Ventricular premature depolarization: Secondary | ICD-10-CM | POA: Diagnosis not present

## 2020-10-02 NOTE — Patient Instructions (Signed)

## 2020-10-02 NOTE — Progress Notes (Signed)
Patient Care Team: Hillary Bow, MD as PCP - General (Internal Medicine)   HPI  Crystal Roth is a 70 y.o. female Seen in follow-up for paroxysmal atrial fibrillation for which she has taken apixoban and flecainide. No bleeding   The patient denies chest pain, shortness of breath, nocturnal dyspnea, orthopnea or peripheral edema.  There have been no palpitations, lightheadedness or syncope.      DATE PR interval QRSduration Flecainide  6/13 202 84 0  1/18  162 102 100  11/18 146 108 150  12/19 180 110 150  12/20 202 120 150  3/22 200 110 150   She was intolerant of acebutolol.  Resulting in fatigue. DCCV  2017, 10/18 ;      DATE TEST EF   7/13 MYOVIEW 74% No Ischemia  10/17 Echo   55-65 % LA (41/1.7/41)        Date Cr K Hgb  10/18 0.81 3.8 14   2/20 0.76 4.3 13.2  9/21 0.8 4.1 14.0    Thromboembolic risk factors ( age -77, HTN-1, Gender-1) for a CHADSVASc Score of >=3   Past Medical History:  Diagnosis Date  . Atrial fibrillation (HCC)    dx 5/13; Apixaban; echo 5/13: mild LVH, EF 60-65%, mod LAe, mild RAE, PASP 29  . Diabetes mellitus   . Fatty liver   . Hypertension   . kidney calculus   . Morbid obesity (HCC)   . PONV (postoperative nausea and vomiting)   . Reactive airway disease     Past Surgical History:  Procedure Laterality Date  . ABDOMINAL HYSTERECTOMY    . CARDIOVERSION  12/27/2011   Procedure: CARDIOVERSION;  Surgeon: Duke Salvia, MD;  Location: [facility] OR;  Service: Cardiovascular;  Laterality: N/A;  . CARDIOVERSION  01/17/2012   Procedure: CARDIOVERSION;  Surgeon: Duke Salvia, MD;  Location: [facility] OR;  Service: Cardiovascular;  Laterality: N/A;  . CARDIOVERSION N/A 03/15/2016   Procedure: CARDIOVERSION;  Surgeon: [name], MD;  Location: [facility] ENDOSCOPY;  Service: Cardiovascular;  Laterality: N/A;  . CARDIOVERSION N/A 05/07/2017   Procedure: CARDIOVERSION;  Surgeon: Chilton Si, MD;  Location: [facility] ENDOSCOPY;  Service:  Cardiovascular;  Laterality: N/A;  . DILATION AND CURETTAGE OF UTERUS    . FOOT SURGERY    . TUBAL LIGATION      Current Outpatient Medications  Medication Sig Dispense Refill  . acetaminophen (TYLENOL) 500 MG tablet Take 500 mg by mouth every 6 (six) hours as needed. Pain     . allopurinol (ZYLOPRIM) 300 MG tablet Take 300 mg by mouth daily.    Marland Kitchen apixaban (ELIQUIS) 5 MG TABS tablet Take 1 tablet (5 mg total) by mouth 2 (two) times daily. Pt is Overdue for follow-up, Needs to see MD for future refills. 180 tablet 0  . benazepril (LOTENSIN) 20 MG tablet Take 20 mg by mouth daily.    . Cholecalciferol (VITAMIN D3) 5000 units TBDP Take by mouth.    . diltiazem (CARDIZEM) 30 MG tablet Take 1 tablet (30 mg total) by mouth every 6 (six) hours as needed (up to 3 doses a day for heart paplitations). 150 tablet 2  . flecainide (TAMBOCOR) 150 MG tablet Take 1 tablet (150 mg total) by mouth 2 (two) times daily. 180 tablet 0  . lidocaine (LIDODERM) 5 % Place 1 patch onto the skin daily. Remove & Discard patch within 12 hours or as directed by MD 10 patch 0  . MAGNESIUM PO  Take 400 mg by mouth 2 (two) times daily.     . metFORMIN (GLUCOPHAGE) 500 MG tablet Take by mouth 2 (two) times daily with a meal. 1 to 2 tablets twice a day    . Multiple Vitamins-Minerals (OCUVITE EXTRA PO) Take by mouth.    . potassium chloride SA (KLOR-CON) 20 MEQ tablet Take 1 tablet by mouth once daily 90 tablet 2  . PROVENTIL HFA 108 (90 BASE) MCG/ACT inhaler INHALE ONE TO TWO PUFFS INTO LUNGS EVERY 6 HOURS AS NEEDED FOR WHEEZING 7 each 2  . tiZANidine (ZANAFLEX) 4 MG tablet Take 4-8 mg by mouth every 8 (eight) hours as needed.    . torsemide (DEMADEX) 20 MG tablet Take 20-40 mg by mouth as directed. Take 20MG  by mouth daily, take an extra 20MG  if needed for fluid gain >3lbs    . Turmeric 500 MG CAPS Take 1 capsule by mouth daily.     No current facility-administered medications for this visit.    Allergies  Allergen  Reactions  . Nsaids Shortness Of Breath  . Metoprolol Other (See Comments)    Pt gained 22 lbs in 2 weeks. Depression. Pt states in made blood sugar irregular as well as caused bradycardia      Review of Systems negative except from HPI and PMH  Physical Exam BP (!) 132/92   Pulse 81   Ht 5\' 4"  (1.626 m)   Wt 259 lb (117.5 kg)   SpO2 97%   BMI 44.46 kg/m  Well developed and nourished in no acute distress HENT normal Neck supple with JVP-  flat   Clear Regular rate and rhythm, no murmurs or gallops Abd-soft with active BS No Clubbing cyanosis edema Skin-warm and dry A & Oriented  Grossly normal sensory and motor function  ECG sinus @ 81 20/12/43  Assessment and  Plan Atrial flutter/fibrillation  Flecainide therapy    Hypertension   Morbid obesity  Metoprolol intolerance   PVCs   Bradycardia    bradycardia seems less an issue, and with ongoing flecainide therapy, would favor trying to add low dose CCB for AV nodal conduction protection  BP is also elevated and this would benefit as well  No significant atrial fib burden Tolerating flecainide and no bleeding >> continue

## 2020-10-31 ENCOUNTER — Other Ambulatory Visit: Payer: Self-pay | Admitting: Internal Medicine

## 2020-10-31 ENCOUNTER — Other Ambulatory Visit: Payer: Self-pay

## 2020-10-31 MED ORDER — ELIQUIS 5 MG PO TABS: 5.0000 mg | ORAL_TABLET | Freq: Two times a day (BID) | ORAL | 1 refills | Status: DC

## 2020-10-31 NOTE — Telephone Encounter (Signed)
Pt last saw Dr Graciela Husbands 10/02/20, last labs 03/21/20 Creat 0.80, age 70, weight 117.5kg, based on specified criteria pt is on appropriate dosage of Eliquis 5mg  BID.  Will refill rx.

## 2020-10-31 NOTE — Telephone Encounter (Signed)
78f, 117.5kg, scr 0.8 03/21/20, lovw/klein 10/02/20

## 2021-02-08 ENCOUNTER — Emergency Department (HOSPITAL_COMMUNITY)
Admission: EM | Admit: 2021-02-08 | Discharge: 2021-02-08 | Disposition: A | Discharge status: Home / Self Care | Payer: Medicare Other | Attending: Emergency Medicine | Admitting: Emergency Medicine

## 2021-02-08 ENCOUNTER — Encounter (HOSPITAL_COMMUNITY): Payer: Self-pay

## 2021-02-08 ENCOUNTER — Other Ambulatory Visit: Payer: Self-pay

## 2021-02-08 DIAGNOSIS — U071 COVID-19: Secondary | ICD-10-CM | POA: Diagnosis not present

## 2021-02-08 DIAGNOSIS — Z79899 Other long term (current) drug therapy: Secondary | ICD-10-CM | POA: Insufficient documentation

## 2021-02-08 DIAGNOSIS — I10 Essential (primary) hypertension: Secondary | ICD-10-CM | POA: Insufficient documentation

## 2021-02-08 DIAGNOSIS — I4891 Unspecified atrial fibrillation: Secondary | ICD-10-CM | POA: Insufficient documentation

## 2021-02-08 DIAGNOSIS — Z7901 Long term (current) use of anticoagulants: Secondary | ICD-10-CM | POA: Insufficient documentation

## 2021-02-08 DIAGNOSIS — E119 Type 2 diabetes mellitus without complications: Secondary | ICD-10-CM | POA: Diagnosis not present

## 2021-02-08 DIAGNOSIS — R059 Cough, unspecified: Secondary | ICD-10-CM | POA: Diagnosis present

## 2021-02-08 DIAGNOSIS — Z7984 Long term (current) use of oral hypoglycemic drugs: Secondary | ICD-10-CM | POA: Diagnosis not present

## 2021-02-08 LAB — CBC WITH DIFFERENTIAL/PLATELET
Abs Immature Granulocytes: 0.01 10*3/uL (ref 0.00–0.07)
Basophils Absolute: 0 10*3/uL (ref 0.0–0.1)
Basophils Relative: 0 %
Eosinophils Absolute: 0 10*3/uL (ref 0.0–0.5)
Eosinophils Relative: 1 %
HCT: 40.9 % (ref 36.0–46.0)
Hemoglobin: 13.3 g/dL (ref 12.0–15.0)
Immature Granulocytes: 0 %
Lymphocytes Relative: 47 %
Lymphs Abs: 1.4 10*3/uL (ref 0.7–4.0)
MCH: 31.5 pg (ref 26.0–34.0)
MCHC: 32.5 g/dL (ref 30.0–36.0)
MCV: 96.9 fL (ref 80.0–100.0)
Monocytes Absolute: 0.4 10*3/uL (ref 0.1–1.0)
Monocytes Relative: 13 %
Neutro Abs: 1.2 10*3/uL — ABNORMAL LOW (ref 1.7–7.7)
Neutrophils Relative %: 39 %
Platelets: 188 10*3/uL (ref 150–400)
RBC: 4.22 MIL/uL (ref 3.87–5.11)
RDW: 13.7 % (ref 11.5–15.5)
WBC: 3.1 10*3/uL — ABNORMAL LOW (ref 4.0–10.5)
nRBC: 0 % (ref 0.0–0.2)

## 2021-02-08 LAB — BASIC METABOLIC PANEL
Anion gap: 10 (ref 5–15)
BUN: 18 mg/dL (ref 8–23)
CO2: 27 mmol/L (ref 22–32)
Calcium: 8.5 mg/dL — ABNORMAL LOW (ref 8.9–10.3)
Chloride: 99 mmol/L (ref 98–111)
Creatinine, Ser: 0.76 mg/dL (ref 0.44–1.00)
GFR, Estimated: >60 mL/min (ref >60)
Glucose, Bld: 151 mg/dL — ABNORMAL HIGH (ref 70–99)
Potassium: 3.3 mmol/L — ABNORMAL LOW (ref 3.5–5.1)
Sodium: 136 mmol/L (ref 135–145)

## 2021-02-08 MED ORDER — SODIUM CHLORIDE 0.9 % IV BOLUS
500.0000 mL | Freq: Once | INTRAVENOUS | Status: AC
  Administered 2021-02-08: 500 mL via INTRAVENOUS

## 2021-02-08 MED ORDER — BEBTELOVIMAB 175 MG/2 ML IV (EUA)
175.0000 mg | Freq: Once | INTRAMUSCULAR | Status: AC
  Administered 2021-02-08: 175 mg via INTRAVENOUS
  Filled 2021-02-08: qty 2

## 2021-02-08 NOTE — ED Triage Notes (Signed)
Pt tested positive for Covid on Tuesday morning. Pt is wanting to have Covid antibodies. Pt complains of some SOB and denies chest pain.

## 2021-02-08 NOTE — ED Provider Notes (Signed)
[facility] EMERGENCY DEPARTMENT Provider Note   CSN: [id number] Arrival date & time: 02/08/21  0356     History Chief Complaint  Patient presents with   Covid Positive    Crystal Roth is a 70 y.o. female.  Patient is a 70 year old female with past medical history including atrial fibrillation on, diabetes, hypertension, obesity.  Patient presenting today with complaints of URI symptoms.  She reports chest congestion, cough, fatigue for the past several days.  She tested positive 2 days ago for COVID-19.  She is here requesting monoclonal antibody treatment.  She denies any chest pain or difficulty breathing.       Past Medical History:  Diagnosis Date   Atrial fibrillation (HCC)    dx 5/13; Apixaban; echo 5/13: mild LVH, EF 60-65%, mod LAe, mild RAE, PASP 29   Diabetes mellitus    Fatty liver    Hypertension    kidney calculus    Morbid obesity (HCC)    PONV (postoperative nausea and vomiting)    Reactive airway disease     Patient Active Problem List   Diagnosis Date Noted   PVC (premature ventricular contraction) 09/19/2020   Essential hypertension, benign 03/14/2013   Hyperlipidemia LDL goal <100 03/14/2013   Diabetes (HCC) 03/14/2013   Morbid obesity (HCC) 03/14/2013   Atrial fibrillation 11/06/2011   Preop cardiovascular exam 11/06/2011    Past Surgical History:  Procedure Laterality Date   ABDOMINAL HYSTERECTOMY     CARDIOVERSION  12/27/2011   Procedure: CARDIOVERSION;  Surgeon: Duke Salvia, MD;  Location: [facility] OR;  Service: Cardiovascular;  Laterality: N/A;   CARDIOVERSION  01/17/2012   Procedure: CARDIOVERSION;  Surgeon: Duke Salvia, MD;  Location: [facility] OR;  Service: Cardiovascular;  Laterality: N/A;   CARDIOVERSION N/A 03/15/2016   Procedure: CARDIOVERSION;  Surgeon: [name], MD;  Location: [facility] ENDOSCOPY;  Service: Cardiovascular;  Laterality: N/A;   CARDIOVERSION N/A 05/07/2017   Procedure: CARDIOVERSION;  Surgeon: Chilton Si, MD;   Location: [facility] ENDOSCOPY;  Service: Cardiovascular;  Laterality: N/A;   DILATION AND CURETTAGE OF UTERUS     FOOT SURGERY     TUBAL LIGATION       OB History   No obstetric history on file.     Family History  Problem Relation Age of Onset   Parkinsonism Mother    Osteoarthritis Father     Social History   Tobacco Use   Smoking status: Never   Smokeless tobacco: Never  Substance Use Topics   Alcohol use: No   Drug use: No    Home Medications Prior to Admission medications   Medication Sig Start Date End Date Taking? Authorizing Provider  acetaminophen (TYLENOL) 500 MG tablet Take 500 mg by mouth every 6 (six) hours as needed. Pain     [provider]  allopurinol (ZYLOPRIM) 300 MG tablet Take 300 mg by mouth daily.    [provider]  apixaban (ELIQUIS) 5 MG TABS tablet Take 1 tablet (5 mg total) by mouth 2 (two) times daily. 10/31/20   Duke Salvia, MD  benazepril (LOTENSIN) 20 MG tablet Take 20 mg by mouth daily.    [provider]  Cholecalciferol (VITAMIN D3) 5000 units TBDP Take by mouth.    [provider]  diltiazem (CARDIZEM) 30 MG tablet Take 1 tablet (30 mg total) by mouth every 6 (six) hours as needed (up to 3 doses a day for heart paplitations). 06/17/19   Duke Salvia, MD  flecainide (TAMBOCOR) 150 MG tablet Take 1 tablet (150 mg total) by mouth 2 (two) times daily. 08/08/20   Duke Salvia, MD  lidocaine (LIDODERM) 5 % Place 1 patch onto the skin daily. Remove & Discard patch within 12 hours or as directed by MD 08/12/18   Pricilla Loveless, MD  MAGNESIUM PO Take 400 mg by mouth 2 (two) times daily.     [provider]  metFORMIN (GLUCOPHAGE) 500 MG tablet Take by mouth 2 (two) times daily with a meal. 1 to 2 tablets twice a day    [provider]  Multiple Vitamins-Minerals (OCUVITE EXTRA PO) Take by mouth.    [provider]  potassium chloride SA (KLOR-CON) 20 MEQ tablet Take 1 tablet by mouth  once daily 06/12/20   Duke Salvia, MD  PROVENTIL HFA 108 (90 BASE) MCG/ACT inhaler INHALE ONE TO TWO PUFFS INTO LUNGS EVERY 6 HOURS AS NEEDED FOR WHEEZING 05/23/14   Luking, Jonna Coup, MD  tiZANidine (ZANAFLEX) 4 MG tablet Take 4-8 mg by mouth every 8 (eight) hours as needed. 06/01/19   [provider]  torsemide (DEMADEX) 20 MG tablet Take 20-40 mg by mouth as directed. Take 20MG  by mouth daily, take an extra 20MG  if needed for fluid gain >3lbs    [provider]  Turmeric 500 MG CAPS Take 1 capsule by mouth daily.    [provider]    Allergies    Nsaids and Metoprolol  Review of Systems   Review of Systems  All other systems reviewed and are negative.  Physical Exam Updated Vital Signs BP (!) 115/57   Pulse (!) 58   Temp 98.8 F (37.1 C)   Resp 18   Ht 5\' 4"  (1.626 m)   Wt 125.2 kg   SpO2 98%   BMI 47.38 kg/m   Physical Exam Vitals and nursing note reviewed.  Constitutional:      General: She is not in acute distress.    Appearance: She is well-developed. She is not diaphoretic.  HENT:     Head: Normocephalic and atraumatic.  Cardiovascular:     Rate and Rhythm: Normal rate and regular rhythm.     Heart sounds: No murmur heard.   No friction rub. No gallop.  Pulmonary:     Effort: Pulmonary effort is normal. No respiratory distress.     Breath sounds: Normal breath sounds. No wheezing.  Abdominal:     General: Bowel sounds are normal. There is no distension.     Palpations: Abdomen is soft.     Tenderness: There is no abdominal tenderness.  Musculoskeletal:        General: Normal range of motion.     Cervical back: Normal range of motion and neck supple.  Skin:    General: Skin is warm and dry.  Neurological:     General: No focal deficit present.     Mental Status: She is alert and oriented to person, place, and time.    ED Results / Procedures / Treatments   Labs (all labs ordered are listed, but only abnormal results are  displayed) Labs Reviewed - No data to display  EKG None  Radiology No results found.  Procedures Procedures   Medications Ordered in ED Medications  bebtelovimab EUA injection SOLN 175 mg (has no administration in time range)    ED Course  I have reviewed the triage vital signs and the nursing notes.  Pertinent labs & imaging results that were  available during my care of the patient were reviewed by me and considered in my medical decision making (see chart for details).    MDM Rules/Calculators/A&P  Patient's vitals are stable and laboratory studies are reassuring.  Patient has received monoclonal antibody without complication.  She has been observed and seems appropriate for discharge.  Final Clinical Impression(s) / ED Diagnoses Final diagnoses:  None    Rx / DC Orders ED Discharge Orders     None        Geoffery Lyons, MD 02/08/21 [id number]

## 2021-02-08 NOTE — Discharge Instructions (Addendum)
Drink plenty of fluids and get plenty of rest.  Isolate at home for the next 5 days, then mask up for the next 5 days when around others.  Return to the emergency department if you develop severe chest pains, high fevers, or other new and concerning symptoms.

## 2021-04-10 ENCOUNTER — Other Ambulatory Visit: Payer: Self-pay | Admitting: Internal Medicine

## 2021-05-24 ENCOUNTER — Other Ambulatory Visit: Payer: Self-pay | Admitting: Internal Medicine

## 2021-05-24 DIAGNOSIS — E876 Hypokalemia: Secondary | ICD-10-CM

## 2021-07-12 ENCOUNTER — Other Ambulatory Visit: Payer: Self-pay | Admitting: Internal Medicine

## 2021-10-15 IMAGING — DX DG THORACIC SPINE 2V
3 series · 3 of 3 positions shown · non-contrast
Comparison: None.

CLINICAL DATA: Upper back pain since a fall 2 days ago.

EXAM:
THORACIC SPINE 2 VIEWS

[t-spine ap]
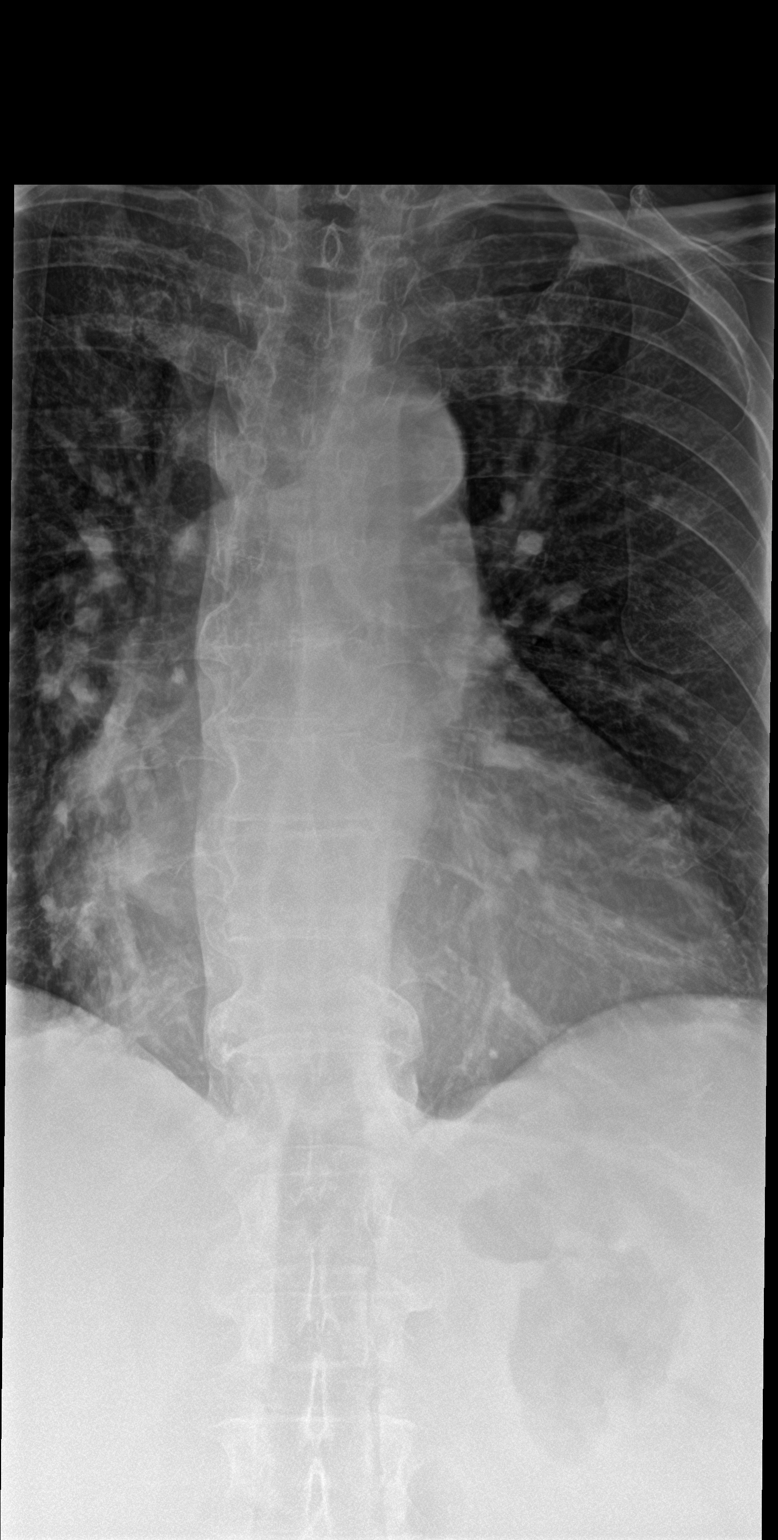

[t-spine lat]
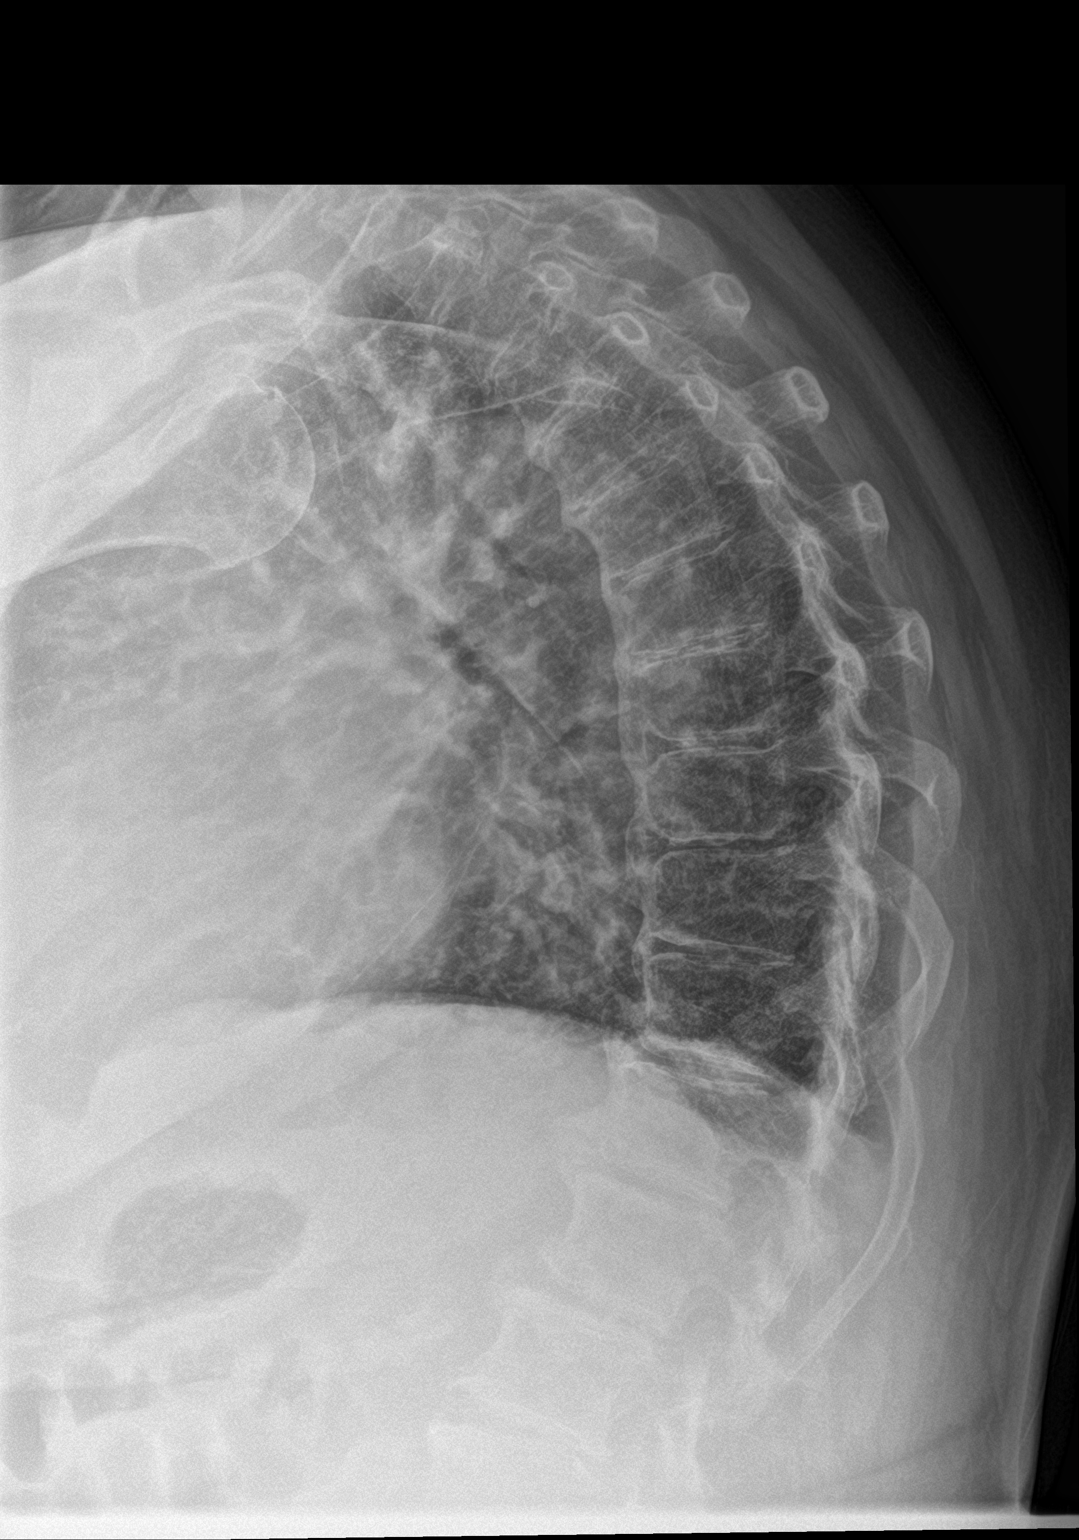

[t-spine swimmers]
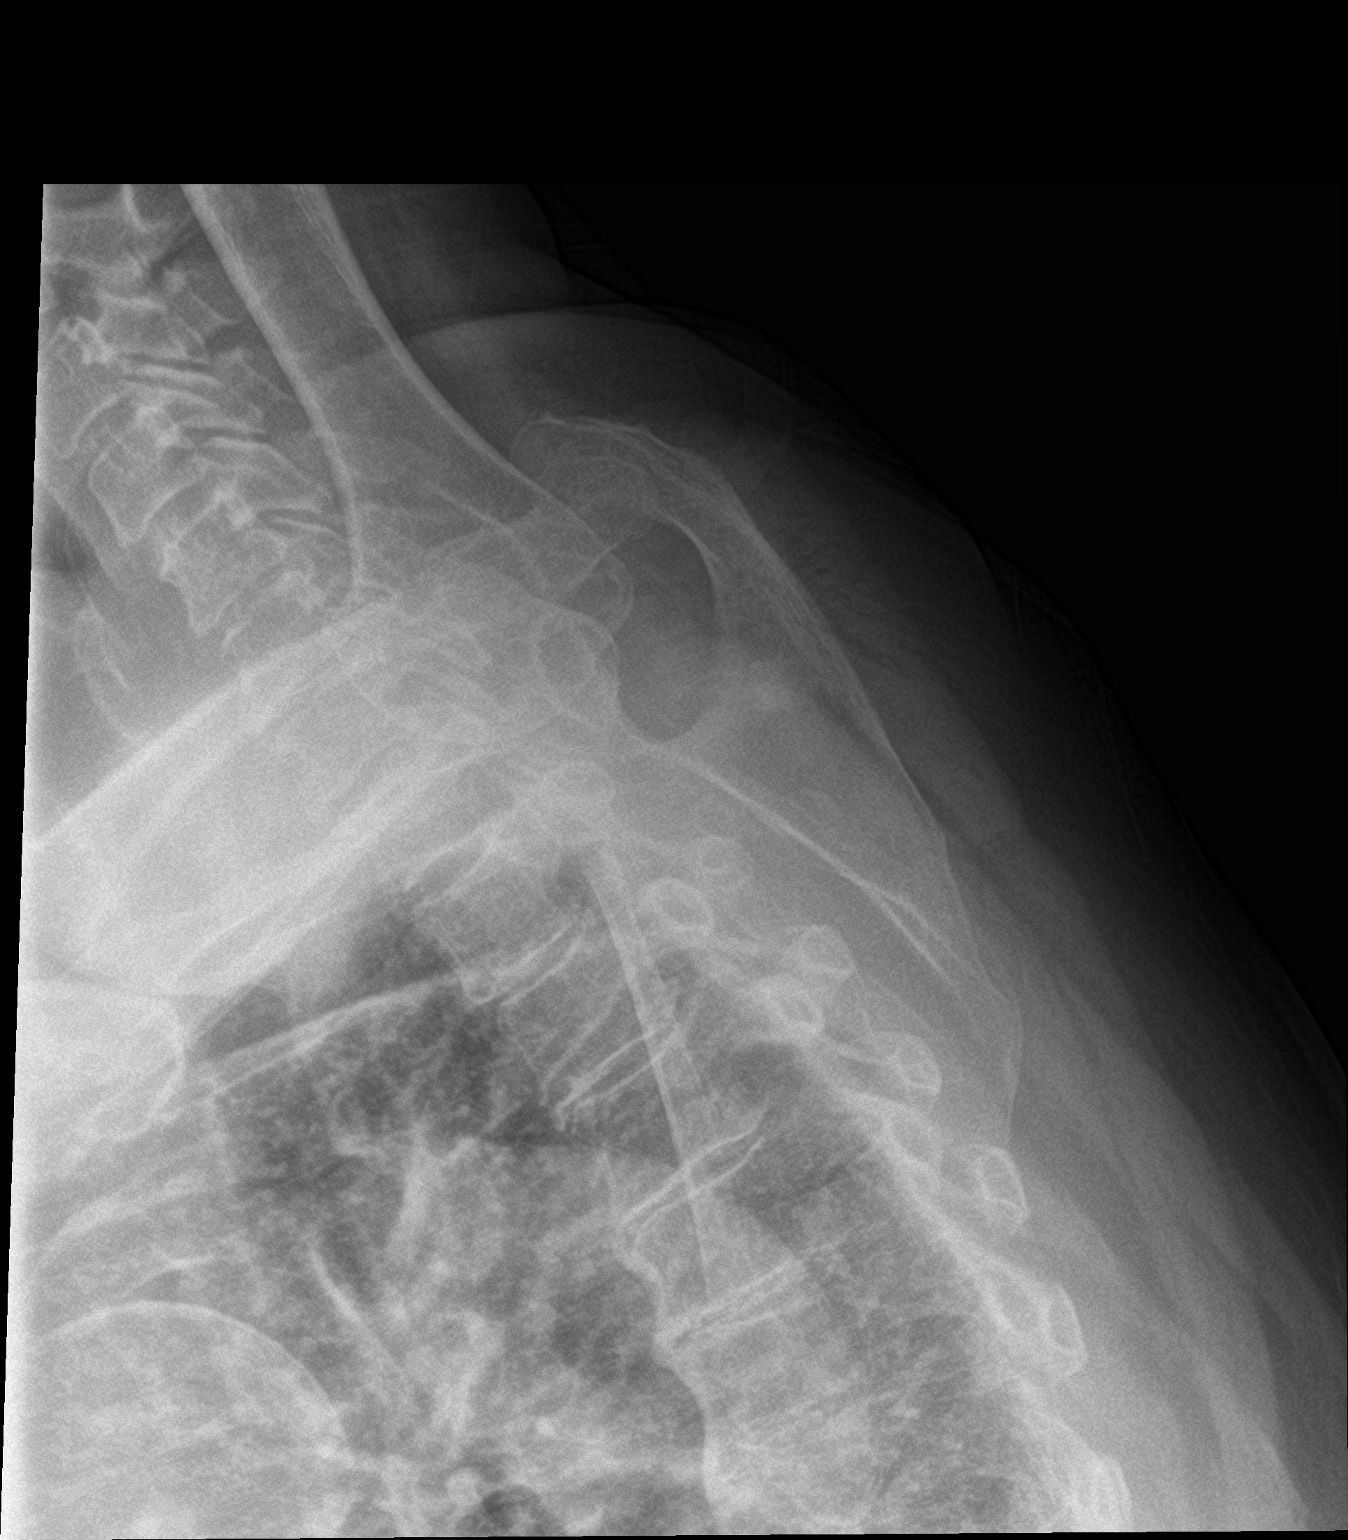

[3 of 3 positions shown; findings below may reference images not displayed]

FINDINGS: There is no evidence of thoracic spine fracture. Accentuation of the
thoracic kyphosis. Anterior osteophytes fuse multiple levels in the
thoracic spine.

Incidental note is made of aortic atherosclerosis.
IMPRESSION: No acute abnormality of the thoracic spine.

Aortic Atherosclerosis ([id number]-D17.7).

## 2021-10-26 ENCOUNTER — Other Ambulatory Visit: Payer: Self-pay | Admitting: Internal Medicine

## 2021-11-06 NOTE — Progress Notes (Signed)
? ?PCP:  Benetta Spar, MD ?Primary Cardiologist: None ?Electrophysiologist: [name], MD  ? ?Crystal Roth is a 71 y.o. female seen today for [name], MD for routine electrophysiology followup.  Since last being seen in our clinic the patient reports doing very well.  she denies chest pain, palpitations, dyspnea, PND, orthopnea, nausea, vomiting, dizziness, syncope, edema, weight gain, or early satiety. ? ?Past Medical History:  ?Diagnosis Date  ? Atrial fibrillation (HCC)   ? dx 5/13; Apixaban; echo 5/13: mild LVH, EF 60-65%, mod LAe, mild RAE, PASP 29  ? Diabetes mellitus   ? Fatty liver   ? Hypertension   ? kidney calculus   ? Morbid obesity (HCC)   ? PONV (postoperative nausea and vomiting)   ? Reactive airway disease   ? ?Past Surgical History:  ?Procedure Laterality Date  ? ABDOMINAL HYSTERECTOMY    ? CARDIOVERSION  12/27/2011  ? Procedure: CARDIOVERSION;  Surgeon: Duke Salvia, MD;  Location: [facility] OR;  Service: Cardiovascular;  Laterality: N/A;  ? CARDIOVERSION  01/17/2012  ? Procedure: CARDIOVERSION;  Surgeon: Duke Salvia, MD;  Location: [facility] OR;  Service: Cardiovascular;  Laterality: N/A;  ? CARDIOVERSION N/A 03/15/2016  ? Procedure: CARDIOVERSION;  Surgeon: [name], MD;  Location: [facility] ENDOSCOPY;  Service: Cardiovascular;  Laterality: N/A;  ? CARDIOVERSION N/A 05/07/2017  ? Procedure: CARDIOVERSION;  Surgeon: Chilton Si, MD;  Location: [facility] ENDOSCOPY;  Service: Cardiovascular;  Laterality: N/A;  ? DILATION AND CURETTAGE OF UTERUS    ? FOOT SURGERY    ? TUBAL LIGATION    ? ? ?Current Outpatient Medications  ?Medication Sig Dispense Refill  ? acetaminophen (TYLENOL) 500 MG tablet Take 500 mg by mouth every 6 (six) hours as needed. Pain ?    ? allopurinol (ZYLOPRIM) 300 MG tablet Take 300 mg by mouth daily.    ? apixaban (ELIQUIS) 5 MG TABS tablet Take 1 tablet (5 mg total) by mouth 2 (two) times daily. 180 tablet 1  ? benazepril (LOTENSIN) 20 MG tablet Take 20 mg by mouth daily.     ? Cholecalciferol (VITAMIN D3) 5000 units TBDP Take by mouth.    ? diltiazem (CARDIZEM) 30 MG tablet TAKE 1 TABLET BY MOUTH EVERY 6 HOURS AS NEEDED (UP TO 3 DOSES A DAY FOR HEART PAPLITATIONS) 120 tablet 3  ? flecainide (TAMBOCOR) 150 MG tablet Take 1 tablet by mouth twice daily 180 tablet 0  ? Glucos-Chond-Hyal Ac-Ca Fructo (MOVE FREE JOINT HEALTH ADVANCE) TABS Take by mouth daily.    ? lidocaine (LIDODERM) 5 % Place 1 patch onto the skin daily. Remove & Discard patch within 12 hours or as directed by MD 10 patch 0  ? MAGNESIUM PO Take 400 mg by mouth 2 (two) times daily.     ? metFORMIN (GLUCOPHAGE) 1000 MG tablet Take 1,000 mg by mouth 2 (two) times daily.    ? Multiple Vitamins-Minerals (OCUVITE EXTRA PO) Take by mouth.    ? potassium chloride SA (KLOR-CON) 20 MEQ tablet Take 1 tablet by mouth once daily 90 tablet 1  ? PROVENTIL HFA 108 (90 BASE) MCG/ACT inhaler INHALE ONE TO TWO PUFFS INTO LUNGS EVERY 6 HOURS AS NEEDED FOR WHEEZING 7 each 2  ? tiZANidine (ZANAFLEX) 4 MG tablet Take 4-8 mg by mouth every 8 (eight) hours as needed.    ? torsemide (DEMADEX) 20 MG tablet Take 20-40 mg by mouth as directed. Take 20MG  by mouth daily, take an extra 20MG  if needed for fluid  gain >3lbs    ? Turmeric 500 MG CAPS Take 1 capsule by mouth daily.    ? ?No current facility-administered medications for this visit.  ? ? ?Allergies  ?Allergen Reactions  ? Nsaids Shortness Of Breath  ? Metoprolol Other (See Comments)  ?  Pt gained 22 lbs in 2 weeks. Depression. Pt states in made blood sugar irregular as well as caused bradycardia  ? ? ?Social History  ? ?Socioeconomic History  ? Marital status: Married  ?  Spouse name: Not on file  ? Number of children: Not on file  ? Years of education: Not on file  ? Highest education level: Not on file  ?Occupational History  ? Not on file  ?Tobacco Use  ? Smoking status: Never  ? Smokeless tobacco: Never  ?Substance and Sexual Activity  ? Alcohol use: No  ? Drug use: No  ? Sexual activity:  Not on file  ?Other Topics Concern  ? Not on file  ?Social History Narrative  ? Not on file  ? ?Social Determinants of Health  ? ?Financial Resource Strain: Not on file  ?Food Insecurity: Not on file  ?Transportation Needs: Not on file  ?Physical Activity: Not on file  ?Stress: Not on file  ?Social Connections: Not on file  ?Intimate Partner Violence: Not on file  ? ? ? ?Review of Systems: ?All other systems reviewed and are otherwise negative except as noted above. ? ?Physical Exam: ?Vitals:  ? 11/13/21 1118  ?BP: 114/64  ?Pulse: 78  ?SpO2: 96%  ?Weight: 229 lb (103.9 kg)  ?Height: 5\' 5"  (1.651 m)  ? ? ?GEN- The patient is well appearing, alert and oriented x 3 today.   ?HEENT: normocephalic, atraumatic; sclera clear, conjunctiva pink; hearing intact; oropharynx clear; neck supple, no JVP ?Lymph- no cervical lymphadenopathy ?Lungs- Clear to ausculation bilaterally, normal work of breathing.  No wheezes, rales, rhonchi ?Heart- Regular rate and rhythm, no murmurs, rubs or gallops, PMI not laterally displaced ?GI- soft, non-tender, non-distended, bowel sounds present, no hepatosplenomegaly ?Extremities- no clubbing, cyanosis, or edema; DP/PT/radial pulses 2+ bilaterally ?MS- no significant deformity or atrophy ?Skin- warm and dry, no rash or lesion ?Psych- euthymic mood, full affect ?Neuro- strength and sensation are intact ? ?EKG is ordered. Personal review of EKG from today shows NSR at 78 bpm, stable intervals on flecainide ? ?Additional studies reviewed include: ?Previous EP office notes.  ? ?Assessment and Plan: ? ?Atrial flutter/fibrillation ?  ?Flecainide therapy   ?  ?Hypertension  ?  ?Morbid obesity ?  ?Metoprolol intolerance  ?  ?PVCs  ?  ?Bradycardia ? ?No significant AF burden ?Overall tolerating flecainide  ?Continue diltiazem prn. Poorly tolerated daily use with bradycardia.  ?May consider amiodarone or tikosyn in the future if off target flecainide effects noted.  ? ?Continue eliquis for CHA2DS2/VASc of  at least 4.  ? Labs scanned from recent PCP visit. ?  ?  ?Follow up with Dr. in 12 months  ? ?Graciela Husbands, PA-C  ?11/13/21 ?11:25 AM  ?

## 2021-11-13 ENCOUNTER — Encounter: Payer: Self-pay | Admitting: Student

## 2021-11-13 ENCOUNTER — Ambulatory Visit (INDEPENDENT_AMBULATORY_CARE_PROVIDER_SITE_OTHER): Payer: Medicare Other | Admitting: Student

## 2021-11-13 VITALS — BP 114/64 | HR 78 | Ht 65.0 in | Wt 229.0 lb

## 2021-11-13 DIAGNOSIS — I493 Ventricular premature depolarization: Secondary | ICD-10-CM | POA: Diagnosis not present

## 2021-11-13 DIAGNOSIS — I10 Essential (primary) hypertension: Secondary | ICD-10-CM | POA: Diagnosis not present

## 2021-11-13 DIAGNOSIS — I480 Paroxysmal atrial fibrillation: Secondary | ICD-10-CM

## 2021-11-13 NOTE — Patient Instructions (Signed)
Medication Instructions:  Your physician recommends that you continue on your current medications as directed. Please refer to the Current Medication list given to you today.  *If you need a refill on your cardiac medications before your next appointment, please call your pharmacy*   Lab Work: None If you have labs (blood work) drawn today and your tests are completely normal, you will receive your results only by: . MyChart Message (if you have MyChart) OR . A paper copy in the mail If you have any lab test that is abnormal or we need to change your treatment, we will call you to review the results.   Follow-Up: At [facility], you and your health needs are our priority.  As part of our continuing mission to provide you with exceptional heart care, we have created designated Provider Care Teams.  These Care Teams include your primary Cardiologist (physician) and Advanced Practice Providers (APPs -  Physician Assistants and Nurse Practitioners) who all work together to provide you with the care you need, when you need it.  Your next appointment:   1 year(s)  The format for your next appointment:   In Person  Provider:   Steven Klein, MD   

## 2021-12-06 ENCOUNTER — Other Ambulatory Visit: Payer: Self-pay | Admitting: Internal Medicine

## 2021-12-06 DIAGNOSIS — E876 Hypokalemia: Secondary | ICD-10-CM

## 2022-02-02 ENCOUNTER — Other Ambulatory Visit: Payer: Self-pay | Admitting: Internal Medicine

## 2022-02-14 ENCOUNTER — Other Ambulatory Visit: Payer: Self-pay | Admitting: Internal Medicine

## 2022-02-14 NOTE — Telephone Encounter (Signed)
Prescription refill request for Eliquis received. Indication: PAF Last office visit: 11/13/21  Polly Cobia PA-C Scr: 0.93 on 10/22/21 Age: 71 Weight: 103.9kg  Based on above findings Eliquis 5mg  twice daily is the appropriate dose.  Refill approved.

## 2022-02-18 IMAGING — DX DG FOOT COMPLETE 3+V*R*
3 series · 3 of 3 positions shown · non-contrast
Comparison: No recent.

CLINICAL DATA: Injury.

EXAM:
RIGHT FOOT COMPLETE - 3+ VIEW

[foot ap]
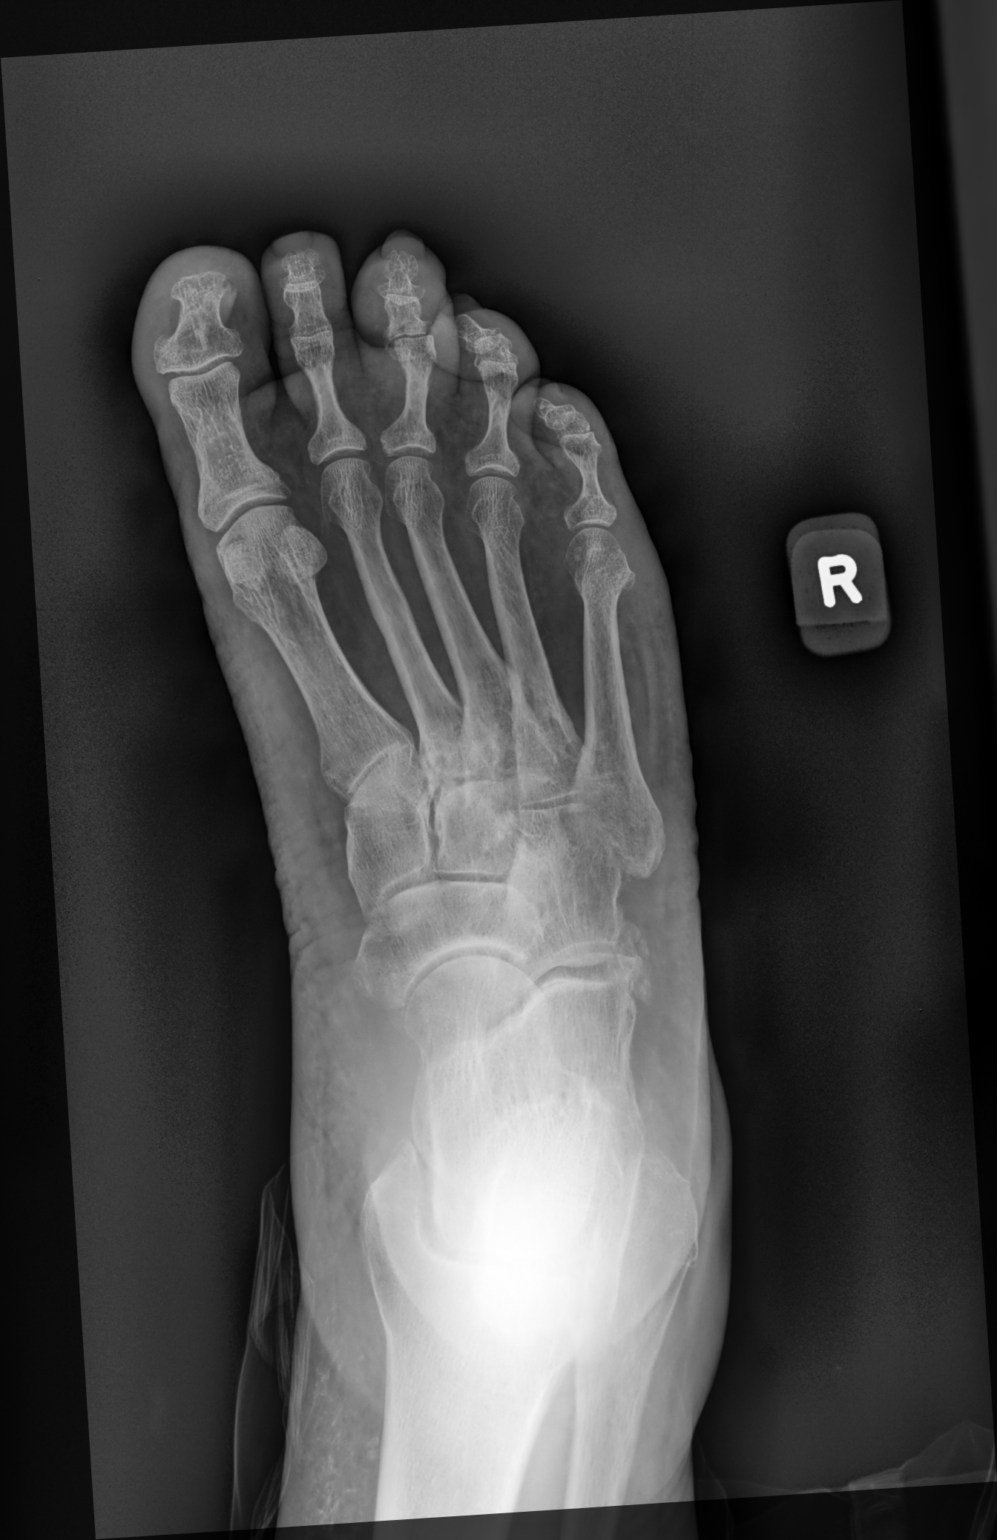

[foot mlo]
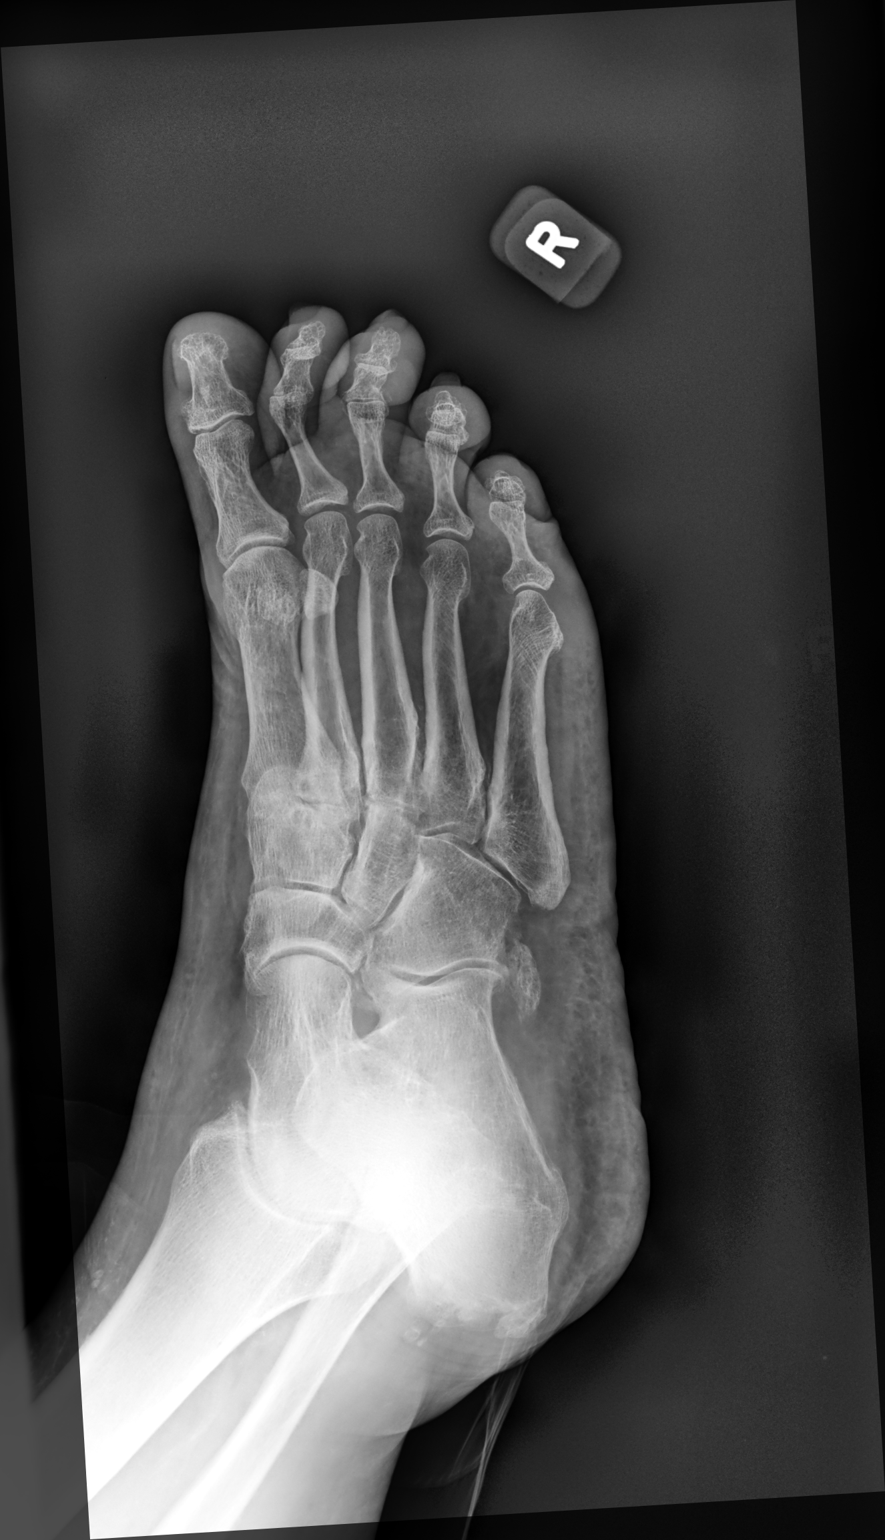

[foot lat]
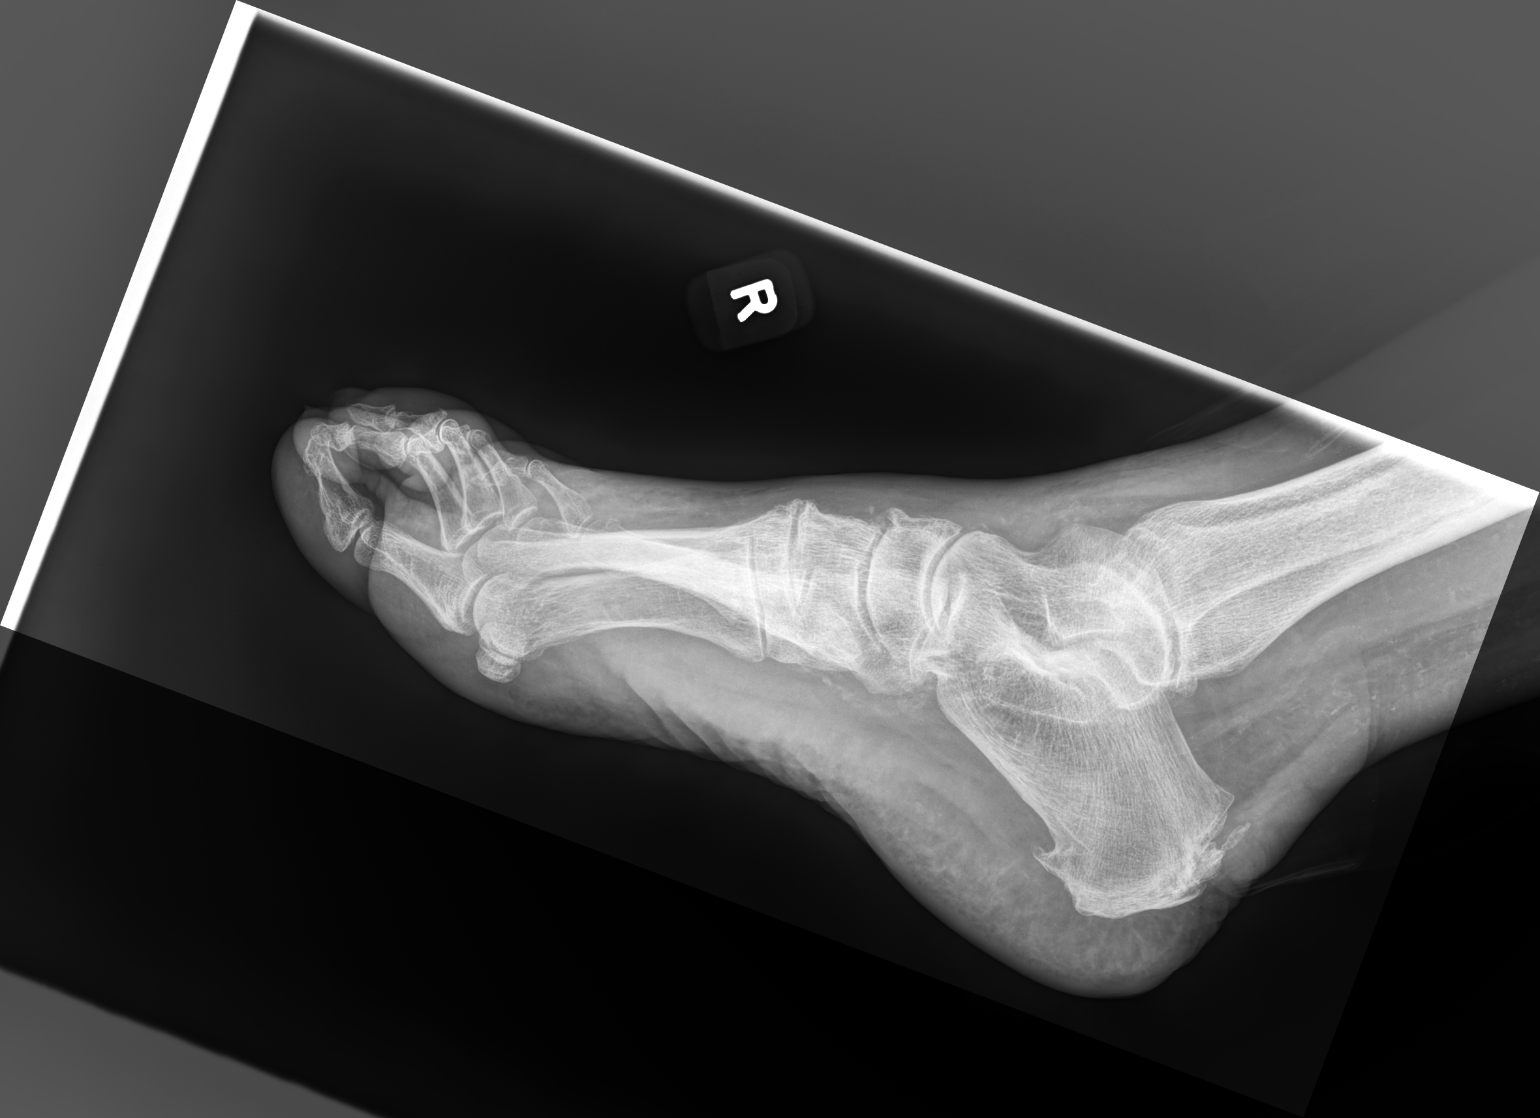

[3 of 3 positions shown; findings below may reference images not displayed]

FINDINGS: Severe degenerative changes noted about the ankle and foot. No
evidence of acute fracture or dislocation. No radiopaque foreign
body. Peripheral vascular calcification cannot be excluded.
IMPRESSION: Diffuse severe degenerative changes noted about the right ankle and
foot. No acute bony abnormality identified.

## 2022-03-28 ENCOUNTER — Other Ambulatory Visit (HOSPITAL_COMMUNITY): Payer: Self-pay | Admitting: Gerontology

## 2022-03-28 DIAGNOSIS — R296 Repeated falls: Secondary | ICD-10-CM

## 2022-04-30 ENCOUNTER — Other Ambulatory Visit: Payer: Self-pay | Admitting: Internal Medicine

## 2022-05-07 ENCOUNTER — Ambulatory Visit (HOSPITAL_COMMUNITY): Payer: PRIVATE HEALTH INSURANCE | Admitting: Physical Therapy

## 2022-05-27 ENCOUNTER — Other Ambulatory Visit (HOSPITAL_COMMUNITY): Payer: PRIVATE HEALTH INSURANCE

## 2022-05-27 ENCOUNTER — Other Ambulatory Visit (HOSPITAL_COMMUNITY): Payer: Self-pay | Admitting: Gerontology

## 2022-05-27 DIAGNOSIS — R296 Repeated falls: Secondary | ICD-10-CM

## 2022-05-27 DIAGNOSIS — Z780 Asymptomatic menopausal state: Secondary | ICD-10-CM

## 2022-05-31 ENCOUNTER — Other Ambulatory Visit (HOSPITAL_COMMUNITY): Payer: PRIVATE HEALTH INSURANCE

## 2022-06-03 ENCOUNTER — Ambulatory Visit (HOSPITAL_COMMUNITY)
Admission: RE | Admit: 2022-06-03 | Discharge: 2022-06-03 | Disposition: A | Discharge status: Home / Self Care | Payer: Medicare Other | Source: Ambulatory Visit | Attending: Gerontology | Admitting: Gerontology

## 2022-06-03 ENCOUNTER — Ambulatory Visit (HOSPITAL_COMMUNITY): Payer: Medicare Other | Attending: Internal Medicine | Admitting: Physical Therapy

## 2022-06-03 DIAGNOSIS — Z780 Asymptomatic menopausal state: Secondary | ICD-10-CM | POA: Insufficient documentation

## 2022-06-03 DIAGNOSIS — R296 Repeated falls: Secondary | ICD-10-CM | POA: Diagnosis present

## 2022-06-03 NOTE — Therapy (Signed)
OUTPATIENT PHYSICAL THERAPY NEURO EVALUATION   Patient Name: Crystal Roth MRN: [id number] DOB:1950-08-26, 71 y.o., female Today's Date: 06/03/2022   PCP: Carrolyn Meiers, MD REFERRING PROVIDER: Carrolyn Meiers, MD  END OF SESSION:  PT End of Session - 06/03/22 1036     Visit Number 1    Number of Visits 2    Date for PT Re-Evaluation 06/28/22    Authorization Type Medicare A and B/ [employer]    PT Start Time 0935    PT Stop Time 1040    PT Time Calculation (min) 65 min    Activity Tolerance Patient tolerated treatment well    Behavior During Therapy [facility] for tasks assessed/performed             Past Medical History:  Diagnosis Date   Atrial fibrillation ([place])    dx 5/13; Apixaban; echo 5/13: mild LVH, EF 60-65%, mod LAe, mild RAE, PASP 29   Diabetes mellitus    Fatty liver    Hypertension    kidney calculus    Morbid obesity (HCC)    PONV (postoperative nausea and vomiting)    Reactive airway disease    Past Surgical History:  Procedure Laterality Date   ABDOMINAL HYSTERECTOMY     CARDIOVERSION  12/27/2011   Procedure: CARDIOVERSION;  Surgeon: Deboraha Sprang, MD;  Location: [place];  Service: Cardiovascular;  Laterality: N/A;   CARDIOVERSION  01/17/2012   Procedure: CARDIOVERSION;  Surgeon: Deboraha Sprang, MD;  Location: [place];  Service: Cardiovascular;  Laterality: N/A;   CARDIOVERSION N/A 03/15/2016   Procedure: CARDIOVERSION;  Surgeon: Thayer Headings, MD;  Location: [place];  Service: Cardiovascular;  Laterality: N/A;   CARDIOVERSION N/A 05/07/2017   Procedure: CARDIOVERSION;  Surgeon: Skeet Latch, MD;  Location: [place];  Service: Cardiovascular;  Laterality: N/A;   [place]     Patient Active Problem List   Diagnosis Date Noted   PVC (premature ventricular contraction) 09/19/2020   Essential hypertension, benign 03/14/2013   Hyperlipidemia LDL goal  <100 03/14/2013   Diabetes ([place]) 03/14/2013   Morbid obesity ([place]) 03/14/2013   Atrial fibrillation 11/06/2011   Preop cardiovascular exam 11/06/2011    ONSET DATE: 03/16/22  REFERRING DIAG: PT eval/tx for repeated falls and HTN  THERAPY DIAG:  Repeated falls  Rationale for Evaluation and Treatment: Rehabilitation  SUBJECTIVE:  SUBJECTIVE STATEMENT: Patient presents to therapy with complaint of falls and balance issues. She slipped of the toilet on 03/16/22. She fell out of the shower the following day and injured her RT arm. She went to ED later that day. She hit her head the first fall. She had brain scan which was unremarkable. She had xrays on her shoulder and had proximal humerus fracture. She has done some exercises she saw from a chiropractor online. This has helped some.  Pt accompanied by: self  PERTINENT HISTORY: a- fib, DM II, family hx of Parkinson, LBP, Osteoporosis (in RT hip)   PAIN:  Are you having pain? Yes: NPRS scale: 5/10 Pain location: RT shoulder  Pain description: dull, aching  Aggravating factors: reaching over head, lifting  Relieving factors: meds, rest   PRECAUTIONS: None  WEIGHT BEARING RESTRICTIONS: No  FALLS: Has patient fallen in last 6 months? Yes. Number of falls 2  LIVING ENVIRONMENT: Lives with: lives with their family and lives with their spouse Lives in: House/apartment Stairs: Yes: External: 4 steps; on left going up Has following equipment at home: Single point cane and Walker - 2 wheeled  PLOF: Independent  PATIENT GOALS: "Need to make sure I'm more stable"  OCCUPATION: Hospice nurse   OBJECTIVE:   DIAGNOSTIC FINDINGS: NA  COGNITION: Overall cognitive status: Within functional limits for tasks assessed   SENSATION: WFL  POSTURE: rounded  shoulders and forward head  UPPER EXTREMITY ROM: Min restriction in RT shoulder flexion and IR with slight pain   UE MMT: 4+ throughout RUE   LOWER EXTREMITY MMT:    MMT Right Eval Left Eval  Hip flexion 4 4  Hip extension 4 4-  Hip abduction 4 4  Hip adduction    Hip internal rotation    Hip external rotation    Knee flexion    Knee extension 4+ 4+  Ankle dorsiflexion 5 5  Ankle plantarflexion    Ankle inversion    Ankle eversion    (Blank rows = not tested)  BED MOBILITY:  Sit to supine Complete Independence Supine to sit Complete Independence Rolling to Right Complete Independence Rolling to Left Complete Independence  TRANSFERS: Assistive device utilized: None  Sit to stand: Complete Independence Stand to sit: Complete Independence Chair to chair: Complete Independence  BALANCE: Tandem: RT foot lead 18 sec; LT foot lead >30 sec  SLS: LLE 13 sec; RLE 10 sec   GAIT: Slightly narrowed stance, decreased hip rotation, flexed trunk, no AD   FUNCTIONAL TESTS:  5 times sit to stand: 14.9 sec with no UE  Timed up and go (TUG): 11.5 sec with no UE    TODAY'S TREATMENT:                                                                                                                              DATE:  06/03/22 Eval     PATIENT EDUCATION: Education details: on eval findings, POC  and HEP  Person educated: Patient Education method: Explanation Education comprehension: verbalized understanding  HOME EXERCISE PROGRAM: Access Code: [email or web address] URL: [email or web address] Date: 06/03/2022 Prepared by: Georges Lynch  Exercises - Heel Toe Raises with Counter Support  - 2 x daily - 7 x weekly - 2 sets - 10 reps - Sit to Stand with Counter Support  - 2 x daily - 7 x weekly - 2 sets - 10 reps - Side Stepping with Counter Support  - 2 x daily - 7 x weekly - 2 sets - 10 reps - Standing Tandem Balance with Counter Support  - 2 x daily - 7 x weekly - 1 sets  - 3 reps - 30 second hold - Standing Single Leg Stance with Counter Support  - 2 x daily - 7 x weekly - 1 sets - 3 reps - 15 sec hold  GOALS: SHORT TERM GOALS: Target date: 06/28/2022  Patient will be independent with initial HEP and self-management strategies to improve functional outcomes Baseline:  Goal status: INITIAL   2. Patient will be able to maintain tandem stance >30 bilat to demo improved balance and reduced risk for falls. Baseline: Tandem: RT foot lead 18 sec; LT foot lead >30 sec Goal status: INITIAL    ASSESSMENT:  CLINICAL IMPRESSION: Patient is a 71 y.o. female who presents to physical therapy with complaint of falls, balance issues. Patient demonstrates slight decreased strength, balance deficits and gait abnormalities which are negatively impacting patient ability to perform ADLs and functional mobility tasks. Patient will benefit from skilled physical therapy services to address these deficits to improve level of function with ADLs, functional mobility tasks, and reduce risk for falls.   OBJECTIVE IMPAIRMENTS: Abnormal gait, decreased balance, and decreased strength.   ACTIVITY LIMITATIONS: locomotion level  PARTICIPATION LIMITATIONS: cleaning, laundry, shopping, community activity, occupation, and yard work  PERSONAL FACTORS:  none  are also affecting patient's functional outcome.   REHAB POTENTIAL: Good  CLINICAL DECISION MAKING: Stable/uncomplicated  EVALUATION COMPLEXITY: Low  PLAN:  PT FREQUENCY: 1x/week  PT DURATION: 3 weeks  PLANNED INTERVENTIONS: Therapeutic exercises, Therapeutic activity, Neuromuscular re-education, Balance training, Gait training, Patient/Family education, Joint manipulation, Joint mobilization, Stair training, Aquatic Therapy, Dry Needling, Electrical stimulation, Spinal manipulation, Spinal mobilization, Cryotherapy, Moist heat, scar mobilization, Taping, Traction, Ultrasound, Biofeedback, Ionotophoresis 4mg /ml Dexamethasone,  and Manual therapy.   PLAN FOR NEXT SESSION: Follow up in 2-3 weeks. Update HEP as needed. DC if confident with home program and balance improved.   10:54 AM, 06/03/22 06/05/22 PT DPT  Physical Therapist with [facility]  [phone]

## 2022-06-26 ENCOUNTER — Encounter (HOSPITAL_COMMUNITY): Payer: PRIVATE HEALTH INSURANCE | Admitting: Physical Therapy

## 2023-01-30 ENCOUNTER — Other Ambulatory Visit: Payer: Self-pay | Admitting: Internal Medicine

## 2023-01-30 DIAGNOSIS — I480 Paroxysmal atrial fibrillation: Secondary | ICD-10-CM

## 2023-01-30 NOTE — Telephone Encounter (Signed)
Prescription refill request for Eliquis received. Indication: Afib  Last office visit: 11/13/21 Lanna Poche)  Scr: 0.93 (10/22/21)  Age: 72 Weight: 103.9kg  Appropriate dose. Refill sent.

## 2023-02-18 ENCOUNTER — Other Ambulatory Visit: Payer: Self-pay | Admitting: Internal Medicine

## 2023-03-14 ENCOUNTER — Other Ambulatory Visit: Payer: Self-pay | Admitting: Internal Medicine

## 2023-03-24 ENCOUNTER — Telehealth: Payer: Self-pay | Admitting: Internal Medicine

## 2023-03-24 MED ORDER — FLECAINIDE ACETATE 150 MG PO TABS: 150.0000 mg | ORAL_TABLET | Freq: Two times a day (BID) | ORAL | 1 refills | Status: DC

## 2023-03-24 NOTE — Telephone Encounter (Signed)
Tried to call pt, but could not get an answer, unclear about leaving a message on VM.

## 2023-03-24 NOTE — Telephone Encounter (Signed)
Pt scheduled to see Francis Dowse, PA-C, 05/02/23.  30 day refill X's 1 to [facility].

## 2023-03-24 NOTE — Telephone Encounter (Signed)
*  STAT* If patient is at the pharmacy, call can be transferred to refill team.   1. Which medications need to be refilled? (please list name of each medication and dose if known)   flecainide (TAMBOCOR) 150 MG tablet   2. Would you like to learn more about the convenience, safety, & potential cost savings by using the [facility]?   3. Are you open to using the [facility] (Type [facility]. ).  4. Which pharmacy/location (including street and city if local pharmacy) is medication to be sent to?  [facility] - [place], [place] - [address]   5. Do they need a 30 day or 90 day supply?   90 day  Patient stated she still has some of this medication left.  Patient stated she did not need the torsemide refilled at this time.  Patient has appointment scheduled on 10/25.

## 2023-03-24 NOTE — Telephone Encounter (Signed)
Pt is requesting a refill on torsemide. This medication has not been refilled since 2020. Pt has an upcoming appt in October 2024 with Francis Dowse, PA. Would Dr. Graciela Husbands like to refill this medication until appt time? Please address

## 2023-03-24 NOTE — Addendum Note (Signed)
Addended by: Burnetta Sabin on: 03/24/2023 01:03 PM   Modules accepted: Orders

## 2023-03-24 NOTE — Telephone Encounter (Signed)
*  STAT* If patient is at the pharmacy, call can be transferred to refill team.   1. Which medications need to be refilled? (please list name of each medication and dose if known) torsemide (DEMADEX) 20 MG tablet   2. Which pharmacy/location (including street and city if local pharmacy) is medication to be sent to?  [facility] - [place], [place] - [address]    3. Do they need a 30 day or 90 day supply? 30

## 2023-04-05 ENCOUNTER — Other Ambulatory Visit: Payer: Self-pay | Admitting: Student

## 2023-04-05 DIAGNOSIS — E876 Hypokalemia: Secondary | ICD-10-CM

## 2023-04-08 ENCOUNTER — Other Ambulatory Visit: Payer: Self-pay

## 2023-04-08 DIAGNOSIS — E876 Hypokalemia: Secondary | ICD-10-CM

## 2023-04-08 MED ORDER — POTASSIUM CHLORIDE CRYS ER 20 MEQ PO TBCR: 20.0000 meq | EXTENDED_RELEASE_TABLET | Freq: Every day | ORAL | 0 refills | Status: DC

## 2023-05-01 NOTE — Progress Notes (Signed)
Electrophysiology Office Note:   Date:  05/02/2023  ID:  Crystal Roth, DOB March 16, 1951, MRN [id number]  Primary Cardiologist: None Electrophysiologist: [name], MD      History of Present Illness:   Crystal Roth is a 72 y.o. female with h/o AF seen today for routine electrophysiology followup.   Last EP visit 11/2021 notable for no significant AF burden.  Pt was having difficulty with bradycardia with PRN diltiazem use.    Since last being seen in our clinic the patient reports doing well. She reports she may have AF 1x per month where she takes a PRN cardizem.  She feel good with her control overall. On her own, she has attempted to reduce her flecainide to 75mg  but had breakthrough AF episodes. She reports she has lost a significant amount of weight - down from 346 lbs to 228 lbs over many years. She has been working out and working on her diet for slow weight loss. Weight in 2018 272 lbs.  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  Travels.  Works out at [employer].   She denies chest pain, palpitations, dyspnea, PND, orthopnea, nausea, vomiting, dizziness, syncope, edema, weight gain, or early satiety.   Review of systems complete and found to be negative unless listed in HPI.   EP Information / Studies Reviewed:    EKG is ordered today. Personal review as below.  EKG Interpretation Date/Time:  Friday May 02 2023 12:02:59 EDT Ventricular Rate:  71 PR Interval:    QRS Duration:  104 QT Interval:  448 QTC Calculation: 486 R Axis:   56  Text Interpretation: Sinus rhythm with PVC PR  Confirmed by Canary Brim ([id number]) on 05/02/2023 12:29:29 PM   Studies:  ECHO 2017 > LVEF 55-65%   Arrhythmia / AAD Paroxysmal AF > on flecainide since 2018 DCCV 2017, 2018    Risk Assessment/Calculations:    CHA2DS2-VASc Score = 4   This indicates a 4.8% annual risk of stroke. The patient's score is based upon: CHF History: 0 HTN History: 1 Diabetes History: 1 Stroke  History: 0 Vascular Disease History: 0 Age Score: 1 Gender Score: 1             Physical Exam:   VS:  BP 118/60   Pulse 71   Ht 5\' 5"  (1.651 m)   Wt 228 lb 8 oz (103.6 kg)   SpO2 97%   BMI 38.02 kg/m    Wt Readings from Last 3 Encounters:  05/02/23 228 lb 8 oz (103.6 kg)  11/13/21 229 lb (103.9 kg)  02/08/21 276 lb (125.2 kg)     GEN: Well nourished, well developed in no acute distress NECK: No JVD; No carotid bruits CARDIAC: Regular rate and rhythm, no murmurs, rubs, gallops RESPIRATORY:  Clear to auscultation without rales, wheezing or rhonchi  ABDOMEN: Soft, non-tender, non-distended EXTREMITIES:  No edema; No deformity   ASSESSMENT AND PLAN:    Paroxysmal Atrial Fibrillation  Atrial Flutter  CHA2DS2-VASc 4 / 4.8% annual risk of CVA. Intolerant of metoprolol.  -no significant AF burden    -on flecainide 150mg  BID > no scheduled nodal blocker as poorly tolerated daily use with bradycardia -discussed not self titrating flecainide -eliquis for stroke prevention   Secondary Hypercoagulable State -Eliquis 5mg  BID, dose reviewed / appropriate  > Cr 02/18/23 with PCP 0.81  PVC's -continue flecainide + cardizem PRN (poorly tolerated with bradycardia) -EKG with stable intervals    -may need to consider amiodarone  or tikosyn in future if off target flecainide effects noted   Bradycardia -asymptomatic, no issues off scheduled AV nodal blocker   HTN  -well controlled on current regimen   HLD -per primary    Follow up with EP APP in 6 months  Signed, Canary Brim, MSN, APRN, NP-C, AGACNP-BC [facility] - Electrophysiology  05/02/2023, 1:20 PM

## 2023-05-02 ENCOUNTER — Other Ambulatory Visit: Payer: Self-pay | Admitting: Student

## 2023-05-02 ENCOUNTER — Ambulatory Visit: Payer: Medicare Other | Attending: Physician Assistant | Admitting: Pulmonary Disease

## 2023-05-02 ENCOUNTER — Encounter: Payer: Self-pay | Admitting: Physician Assistant

## 2023-05-02 VITALS — BP 118/60 | HR 71 | Ht 65.0 in | Wt 228.5 lb

## 2023-05-02 DIAGNOSIS — D6869 Other thrombophilia: Secondary | ICD-10-CM

## 2023-05-02 DIAGNOSIS — E876 Hypokalemia: Secondary | ICD-10-CM

## 2023-05-02 DIAGNOSIS — Z79899 Other long term (current) drug therapy: Secondary | ICD-10-CM | POA: Diagnosis not present

## 2023-05-02 DIAGNOSIS — I480 Paroxysmal atrial fibrillation: Secondary | ICD-10-CM

## 2023-05-02 DIAGNOSIS — I10 Essential (primary) hypertension: Secondary | ICD-10-CM

## 2023-05-02 DIAGNOSIS — I493 Ventricular premature depolarization: Secondary | ICD-10-CM | POA: Diagnosis present

## 2023-05-02 MED ORDER — FLECAINIDE ACETATE 150 MG PO TABS: 150.0000 mg | ORAL_TABLET | Freq: Two times a day (BID) | ORAL | 1 refills | Status: DC

## 2023-05-02 NOTE — Patient Instructions (Signed)
Medication Instructions:  Your physician recommends that you continue on your current medications as directed. Please refer to the Current Medication list given to you today.  *If you need a refill on your cardiac medications before your next appointment, please call your pharmacy*  Lab Work: None ordered If you have labs (blood work) drawn today and your tests are completely normal, you will receive your results only by: MyChart Message (if you have MyChart) OR A paper copy in the mail If you have any lab test that is abnormal or we need to change your treatment, we will call you to review the results.  Follow-Up: At [facility], you and your health needs are our priority.  As part of our continuing mission to provide you with exceptional heart care, we have created designated Provider Care Teams.  These Care Teams include your primary Cardiologist (physician) and Advanced Practice Providers (APPs -  Physician Assistants and Nurse Practitioners) who all work together to provide you with the care you need, when you need it.  Your next appointment:   6 month(s)  Provider:   You will see one of the following Advanced Practice Providers on your designated Care Team:   Francis Dowse, Charlott Holler [address] [place], [place] Sherie Don, NP Canary Brim, NP

## 2023-07-23 ENCOUNTER — Other Ambulatory Visit: Payer: Self-pay | Admitting: Internal Medicine

## 2023-07-23 DIAGNOSIS — I480 Paroxysmal atrial fibrillation: Secondary | ICD-10-CM

## 2023-07-23 NOTE — Telephone Encounter (Signed)
 Eliquis  5mg  refill request received. Patient is 73 years old, weight-103.6kg, Crea- 0.67 on 03/04/23 via [facility] from [place], Diagnosis-Afib, and last seen by Creighton Doffing on 05/02/23. Dose is appropriate based on dosing criteria.

## 2023-12-09 ENCOUNTER — Emergency Department (HOSPITAL_COMMUNITY)

## 2023-12-09 ENCOUNTER — Other Ambulatory Visit: Payer: Self-pay

## 2023-12-09 ENCOUNTER — Emergency Department (HOSPITAL_COMMUNITY)
Admission: EM | Admit: 2023-12-09 | Discharge: 2023-12-09 | Disposition: A | Discharge status: Home / Self Care | Attending: Emergency Medicine | Admitting: Emergency Medicine

## 2023-12-09 DIAGNOSIS — Z23 Encounter for immunization: Secondary | ICD-10-CM | POA: Insufficient documentation

## 2023-12-09 DIAGNOSIS — W01198A Fall on same level from slipping, tripping and stumbling with subsequent striking against other object, initial encounter: Secondary | ICD-10-CM | POA: Diagnosis not present

## 2023-12-09 DIAGNOSIS — I10 Essential (primary) hypertension: Secondary | ICD-10-CM | POA: Insufficient documentation

## 2023-12-09 DIAGNOSIS — E119 Type 2 diabetes mellitus without complications: Secondary | ICD-10-CM | POA: Diagnosis not present

## 2023-12-09 DIAGNOSIS — Z7901 Long term (current) use of anticoagulants: Secondary | ICD-10-CM | POA: Insufficient documentation

## 2023-12-09 DIAGNOSIS — R0789 Other chest pain: Secondary | ICD-10-CM | POA: Diagnosis not present

## 2023-12-09 DIAGNOSIS — S01412A Laceration without foreign body of left cheek and temporomandibular area, initial encounter: Secondary | ICD-10-CM | POA: Insufficient documentation

## 2023-12-09 DIAGNOSIS — Z7984 Long term (current) use of oral hypoglycemic drugs: Secondary | ICD-10-CM | POA: Diagnosis not present

## 2023-12-09 DIAGNOSIS — S8002XA Contusion of left knee, initial encounter: Secondary | ICD-10-CM | POA: Diagnosis not present

## 2023-12-09 DIAGNOSIS — S8001XA Contusion of right knee, initial encounter: Secondary | ICD-10-CM | POA: Diagnosis not present

## 2023-12-09 DIAGNOSIS — S0083XA Contusion of other part of head, initial encounter: Secondary | ICD-10-CM

## 2023-12-09 DIAGNOSIS — I4891 Unspecified atrial fibrillation: Secondary | ICD-10-CM | POA: Diagnosis not present

## 2023-12-09 DIAGNOSIS — S0181XA Laceration without foreign body of other part of head, initial encounter: Secondary | ICD-10-CM

## 2023-12-09 DIAGNOSIS — S0990XA Unspecified injury of head, initial encounter: Secondary | ICD-10-CM | POA: Diagnosis present

## 2023-12-09 MED ORDER — TETANUS-DIPHTH-ACELL PERTUSSIS 5-2.5-18.5 LF-MCG/0.5 IM SUSY
0.5000 mL | PREFILLED_SYRINGE | Freq: Once | INTRAMUSCULAR | Status: AC
  Administered 2023-12-09: 0.5 mL via INTRAMUSCULAR
  Filled 2023-12-09: qty 0.5

## 2023-12-09 NOTE — Progress Notes (Signed)
   12/09/23 0430  Spiritual Encounters  Type of Visit Initial  Care provided to: Pt not available  Referral source Trauma page  Reason for visit Trauma  OnCall Visit No   Chaplain responded to a level two trauma. The patient was attended to by the medical team.  If a chaplain is requested someone will respond.   Clarence Croak [facility]  [phone]

## 2023-12-09 NOTE — ED Provider Notes (Signed)
 [place] EMERGENCY DEPARTMENT AT [facility] Provider Note   CSN: [id number] Arrival date & time: 12/09/23  0409     History  Chief Complaint  Patient presents with   Fall on Thinners    Crystal Roth is a 73 y.o. female.  HPI     This is a 73 year old female who presents with head injury.  Patient reports that she had a mechanical fall around 8 PM last night.  She is on Eliquis .  She tripped off a concrete landing and fell on her left side.  She has a laceration to the left cheek and pain to the left chest wall.  No shortness of breath.  She is not had any vision changes.  Denies neck pain.  Patient states that she did not lose consciousness.  She has been ambulatory.  Home Medications Prior to Admission medications   Medication Sig Start Date End Date Taking? Authorizing Provider  acetaminophen  (TYLENOL ) 500 MG tablet Take 500 mg by mouth every 6 (six) hours as needed. Pain     [provider]  allopurinol  (ZYLOPRIM ) 300 MG tablet Take 300 mg by mouth daily.    [provider]  apixaban  (ELIQUIS ) 5 MG TABS tablet Take 1 tablet by mouth twice daily 07/23/23   Verona Goodwill, MD  benazepril  (LOTENSIN ) 20 MG tablet Take 20 mg by mouth daily.    [provider]  Cholecalciferol (VITAMIN D3) 5000 units TBDP Take 1 capsule by mouth 2 (two) times daily.    [provider]  diltiazem  (CARDIZEM ) 30 MG tablet TAKE 1 TABLET BY MOUTH EVERY 6 HOURS AS NEEDED (UP TO 3 DOSES A DAY FOR HEART PAPLITATIONS) 04/11/21   Verona Goodwill, MD  flecainide  (TAMBOCOR ) 150 MG tablet Take 1 tablet (150 mg total) by mouth 2 (two) times daily. 05/02/23   Thomasena Fleming, NP  Glucos-Chond-Hyal Ac-Ca Fructo (MOVE FREE JOINT HEALTH ADVANCE) TABS Take 1 tablet by mouth daily.    [provider]  lidocaine  (LIDODERM ) 5 % Place 1 patch onto the skin daily. Remove & Discard patch within 12 hours or as directed by MD 08/12/18   Jerilynn Montenegro, MD  MAGNESIUM PO  Take 400 mg by mouth 2 (two) times daily.     [provider]  metFORMIN  (GLUCOPHAGE ) 1000 MG tablet Take 1,000 mg by mouth 2 (two) times daily. 10/10/21   [provider]  Multiple Vitamins-Minerals (PRESERVISION AREDS 2 PO) Take 1 capsule by mouth 2 (two) times daily.    [provider]  potassium chloride  SA (KLOR-CON  M20) 20 MEQ tablet Take 1 tablet by mouth once daily 05/05/23   Tylene Galla, PA-C  PROVENTIL  HFA 108 (90 BASE) MCG/ACT inhaler INHALE ONE TO TWO PUFFS INTO LUNGS EVERY 6 HOURS AS NEEDED FOR WHEEZING 05/23/14   Luking, Jackelyn Marvel, MD  tiZANidine (ZANAFLEX) 4 MG tablet Take 4-8 mg by mouth every 8 (eight) hours as needed. 06/01/19   [provider]  torsemide (DEMADEX) 20 MG tablet Take 1-2 tablets by mouth daily.    [provider]  Turmeric 500 MG CAPS Take 1 capsule by mouth daily.    [provider]      Allergies    Nsaids and Metoprolol     Review of Systems   Review of Systems  Skin:  Positive for wound.  Neurological:  Negative for dizziness and light-headedness.  All other systems reviewed and are negative.   Physical Exam Updated Vital Signs  BP (!) 150/54   Pulse 64   Temp (!) 97.1 F (36.2 C) (Tympanic)   Resp 14   Ht 1.651 m (5\' 5" )   Wt 103 kg   SpO2 97%   BMI 37.79 kg/m  Physical Exam Vitals and nursing note reviewed.  Constitutional:      Appearance: She is well-developed. She is obese. She is not ill-appearing.  HENT:     Head: Normocephalic.     Comments: Superficial laceration left cheek with underlying hematoma, midface stable    Mouth/Throat:     Mouth: Mucous membranes are moist.  Eyes:     Pupils: Pupils are equal, round, and reactive to light.     Comments: Extraocular movements intact, ecchymosis about the left orbit  Cardiovascular:     Rate and Rhythm: Normal rate and regular rhythm.     Heart sounds: Normal heart sounds.  Pulmonary:     Effort: Pulmonary effort is  normal. No respiratory distress.     Breath sounds: No wheezing.     Comments: Left-sided chest wall tenderness to palpation, no overlying skin changes or crepitus Chest:     Chest wall: Tenderness present.  Abdominal:     General: Bowel sounds are normal.     Palpations: Abdomen is soft.     Tenderness: There is no abdominal tenderness. There is no guarding or rebound.  Musculoskeletal:        General: Normal range of motion.     Cervical back: Neck supple.     Comments: Normal range of motion bilateral hips and knees  Skin:    General: Skin is warm and dry.     Comments: Contusion bilateral knees  Neurological:     Mental Status: She is alert and oriented to person, place, and time.  Psychiatric:        Mood and Affect: Mood normal.     ED Results / Procedures / Treatments   Labs (all labs ordered are listed, but only abnormal results are displayed) Labs Reviewed - No data to display  EKG None  Radiology CT CERVICAL SPINE WO CONTRAST Result Date: 12/09/2023 EXAM: CT CERVICAL SPINE WITHOUT CONTRAST 12/09/2023 04:45:00 AM TECHNIQUE: CT of the cervical was performed without the administration of intravenous contrast. Multiplanar reformatted images are provided for review. Automated exposure control, iterative reconstruction, and/or weight based adjustment of the mA/kV was utilized to reduce the radiation dose to as low as reasonably achievable. COMPARISON: None available. CLINICAL HISTORY: Polytrauma, blunt. Pt arrived POV from home. At 2100 (12/08/23) Pt reports tripping over the concrete causing her to fall, landing on L side and hit her head. Pt has small laceration under the L eye (from glasses), bruising to the L eye, L side of the mouth and bilateral arms. Pt does take eliquis . Denies LOC. FINDINGS: CERVICAL SPINE: BONES/ALIGNMENT: There is no acute fracture or traumatic malalignment. DEGENERATIVE CHANGES: Multilevel degenerative changes are present. Partially calcified soft disc  protrusions are present at C3-4 and C4-5 with partial effacement of the ventral CSF. Moderate foraminal stenosis is present on the right at C5-6 and bilaterally at C6-7 and C7-T1 due to uncovertebral spurring. SOFT TISSUES: There is no prevertebral soft tissue swelling. IMPRESSION: 1. No acute abnormality of the cervical spine related to the reported trauma. 2. Multilevel degenerative changes including partially calcified soft disc protrusions at C3-4 and C4-5 with partial effacement of the ventral CSF, and moderate foraminal stenosis on the right at C5-6 and bilaterally at C6-7 and C7-T1  due to uncovertebral spurring. Electronically signed by: Audree Leas MD 12/09/2023 05:03 AM EDT RP Workstation: [id number]   DG Chest Port 1 View Result Date: 12/09/2023 EXAM: 1 VIEW XRAY OF THE CHEST 12/09/2023 04:37:00 AM COMPARISON: None available. CLINICAL HISTORY: Trauma. Pt reports tripping over the concrete causing her to fall, landing on L side and hit her head. FINDINGS: LUNGS AND PLEURA: Lung volumes are low. Mild pulmonary vascular congestion is present. No focal pulmonary opacity. No pulmonary edema. No pleural effusion. No pneumothorax. HEART AND MEDIASTINUM: Atherosclerotic changes are present at the aortic arch. No acute abnormality of the cardiac and mediastinal silhouettes. BONES AND SOFT TISSUES: Degenerative changes are present at the right shoulder. No acute osseous abnormality. IMPRESSION: 1. No acute process. 2. Mild pulmonary vascular congestion. Electronically signed by: Audree Leas MD 12/09/2023 05:00 AM EDT RP Workstation: [id number]   CT MAXILLOFACIAL WO CONTRAST Result Date: 12/09/2023 EXAM: CT OF THE FACE WITHOUT CONTRAST 12/09/2023 04:45:00 AM TECHNIQUE: CT of the face was performed without the administration of intravenous contrast. Multiplanar reformatted images are provided for review. Automated exposure control, iterative reconstruction, and/or weight based adjustment of the  mA/kV was utilized to reduce the radiation dose to as low as reasonably achievable. COMPARISON: None available. CLINICAL HISTORY: Facial trauma, blunt. Pt arrived POV from home. At 2100 (12/08/23) Pt reports tripping over the concrete causing her to fall, landing on L side and hit her head. Pt has small laceration under the L eye (from glasses) Pt does take eliquis . FINDINGS: FACIAL BONES: The maxilla, pterygoid plates and zygomatic arches are intact. The mandible is intact. The mandibular condyles are normally situated. The nasal bones and maxillary nasal processes are intact. No acute fracture is seen. ORBITS: Bilateral lens replacements are noted. The globes and orbits are otherwise within normal limits. The extraocular muscles, optic nerve sheath complexes and lacrimal glands appear unremarkable. No retrobulbar hematoma or mass is seen. The orbital walls and rims are intact. SINUSES AND MASTOIDS: The paranasal sinuses and mastoid air cells are well aerated. SOFT TISSUES: A left infraorbital facial hematoma is present without underlying fracture or foreign body. Mild soft tissue stranding extends more inferiorly over the left side of the face. IMPRESSION: 1. Left infraorbital facial hematoma without underlying fracture or foreign body. 2. Mild soft tissue stranding extends more inferiorly over the left side of the face. Electronically signed by: Audree Leas MD 12/09/2023 04:58 AM EDT RP Workstation: [id number]   CT HEAD WO CONTRAST Result Date: 12/09/2023 EXAM: CT HEAD WITHOUT 12/09/2023 04:45:00 AM TECHNIQUE: CT of the head was performed without the administration of intravenous contrast. Automated exposure control, iterative reconstruction, and/or weight based adjustment of the mA/kV was utilized to reduce the radiation dose to as low as reasonably achievable. COMPARISON: None available. CLINICAL HISTORY: Head trauma, moderate-severe. At 2100 (12/08/23) Pt reports tripping over the concrete causing her to  fall, landing on L side and hit her head. Pt has small laceration under the L eye (from glasses), Pt does take eliquis . FINDINGS: BRAIN AND VENTRICLES: There is no acute intracranial hemorrhage, mass effect or midline shift. No abnormal extra-axial fluid collection. The gray-white differentiation is maintained without an acute infarct. There is no hydrocephalus. Periventricular and scattered subcortical white matter hypoattenuation is moderately advanced for age. A remote lacunar infarct is present in the right basal ganglia. A remote lacunar infarct is present in the left thalamus. ORBITS: A left infraorbital hematoma is present. No underlying fracture or foreign body is present. SINUSES: The visualized  paranasal sinuses and mastoid air cells demonstrate no acute abnormality. SOFT TISSUES AND SKULL: No acute abnormality of the visualized skull or soft tissues. Atherosclerotic calcifications are present in the cavernous carotid arteries bilaterally and at the dural margin of both vertebral arteries. No hyperdense vessel is present. IMPRESSION: 1. No acute intracranial abnormality. 2. Left infraorbital hematoma without underlying fracture or foreign body. 3. Moderate diffuse white matter disease likely reflects the sequelae of chronic microvascular ischemia. 4. 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 Electronically signed by: Audree Leas MD 12/09/2023 04:55 AM EDT RP Workstation: [id number]    Procedures Procedures    Medications Ordered in ED Medications  Tdap (BOOSTRIX) injection 0.5 mL (0.5 mLs Intramuscular Given 12/09/23 0452)    ED Course/ Medical Decision Making/ A&P                                 Medical Decision Making Amount and/or Complexity of Data Reviewed Radiology: ordered.  Risk Prescription drug management.   This patient presents to the ED for concern of fall, this involves an extensive number of treatment options, and is a complaint  that carries with it a high risk of complications and morbidity.  I considered the following differential and admission for this acute, potentially life threatening condition.  The differential diagnosis includes head injury, contusion, facial fracture, cervical spine fracture  MDM:    This is a 73 year old female who presents after fall.  Reports that she tripped and fell.  She has obvious contusion to the left eye without underlying laceration.  Laceration is superficial.  Is greater than 8 hours old.  CT scan and x-ray imaging obtained and shows no evidence of acute injury.  Vital signs are stable.  Laceration was being cleaned.  She had a small bleeder.   Given the superficial nature, feel that she would likely bleed more given that she is on Eliquis  if I placed any stitches given how thin her skin is.  Thus we opted for wound care.    (Labs, imaging, consults)  Labs: I Ordered, and personally interpreted labs.  The pertinent results include: None  Imaging Studies ordered: I ordered imaging studies including CT imaging I independently visualized and interpreted imaging. I agree with the radiologist interpretation  Additional history obtained from chart review.  External records from outside source obtained and reviewed including prior evaluations  Cardiac Monitoring: The patient was maintained on a cardiac monitor.  If on the cardiac monitor, I personally viewed and interpreted the cardiac monitored which showed an underlying rhythm of: NS  Reevaluation: After the interventions noted above, I reevaluated the patient and found that they have :improved  Social Determinants of Health:  lives independently  Disposition: Discharge  Co morbidities that complicate the patient evaluation  Past Medical History:  Diagnosis Date   Atrial fibrillation (HCC)    dx 5/13; Apixaban ; echo 5/13: mild LVH, EF 60-65%, mod LAe, mild RAE, PASP 29   Diabetes mellitus    Fatty liver    Hypertension     kidney calculus    Morbid obesity (HCC)    PONV (postoperative nausea and vomiting)    Reactive airway disease      Medicines Meds ordered this encounter  Medications   Tdap (BOOSTRIX) injection 0.5 mL    I have reviewed the patients home medicines and have made adjustments as needed  Problem List / ED Course:  Problem List Items Addressed This Visit   None Visit Diagnoses       Contusion of face, initial encounter    -  Primary     Facial laceration, initial encounter                       Final Clinical Impression(s) / ED Diagnoses Final diagnoses:  None    Rx / DC Orders ED Discharge Orders     None         Rory Collard, MD 12/09/23 [phone]

## 2023-12-09 NOTE — Discharge Instructions (Signed)
 You were seen today after a fall.  You have significant bruising to the face with a laceration.  Apply ice to swelling.  Apply antibiotic ointment to the laceration.

## 2023-12-09 NOTE — ED Triage Notes (Addendum)
 Pt arrived POV from home. At 2100 (12/08/23) Pt reports tripping over the concrete causing her to fall, landing on L side and hit her head. Pt has small laceration under the L eye (from glasses), bruising to the L eye,  L side of the mouth and bilateral arms. Pt does take eliquis . Denies LOC

## 2024-01-22 NOTE — Progress Notes (Unsigned)
   Electrophysiology Office Note:   Date:  01/23/2024  ID:  FRONIA DEPASS, DOB April 01, 1951, MRN [id number]  Primary Cardiologist: None Electrophysiologist: Elspeth Sage, MD      History of Present Illness:   Crystal Roth is a 73 y.o. female with h/o AF, PVCs, and HTN seen today for routine electrophysiology followup.   Since last being seen in our clinic the patient reports doing OK. She was seen after fall 6/3 and was noted to be in AF at that time; feels like she has been out of rhythm since.  Tolerating it better than previously, since she has not had any RVR. Taking torsemide 20-40 mg daily on sliding scale to manage edema. Denies undue SOB.   Husband is going through a difficult time medically right now. They have a trip planned to [place] in September. She wants to proceed with this before she makes any changes or procedures.   Review of systems complete and found to be negative unless listed in HPI.   EP Information / Studies Reviewed:    EKG is ordered today. Personal review as below.  EKG Interpretation Date/Time:  Friday January 23 2024 10:06:26 EDT Ventricular Rate:  77 PR Interval:    QRS Duration:  110 QT Interval:  442 QTC Calculation: 500 R Axis:   55  Text Interpretation: Atrial fibrillation with premature ventricular or aberrantly conducted complexes Low voltage QRS Incomplete left bundle branch block Nonspecific ST abnormality Prolonged QT When compared with ECG of 02-May-2023 12:02, Atrial fibrillation has replaced Junctional rhythm Confirmed by Lesia Sharper [phone]) on 01/23/2024 10:16:20 AM    Arrhythmia/Device History No specialty comments available.   Physical Exam:   VS:  BP 126/72 (BP Location: Right Arm, Patient Position: Sitting, Cuff Size: Large)   Pulse 77   Ht 5' 5 (1.651 m)   Wt 209 lb (94.8 kg)   SpO2 98%   BMI 34.78 kg/m    Wt Readings from Last 3 Encounters:  01/23/24 209 lb (94.8 kg)  12/09/23 227 lb 1.2 oz (103 kg)  05/02/23 228 lb 8  oz (103.6 kg)     GEN: No acute distress NECK: No JVD; No carotid bruits CARDIAC: Irregularly irregular rate and rhythm, no murmurs, rubs, gallops RESPIRATORY:  Clear to auscultation without rales, wheezing or rhonchi  ABDOMEN: Soft, non-tender, non-distended EXTREMITIES:  Trace edema; No deformity   ASSESSMENT AND PLAN:    Persistent AF Paroxysmal AFL EKG today shows rate controlled AF.   Continue flecainide  150 mg BID Not on BB with prior bradycardia Continue eliquis  5 mg BID for CHA2DS2VASc of at least 4 Baseline QTc too long for Tikosyn.  Prefers to avoid amio due to side effect profile.   Offered cardioversion; Doesn't feel like she can schedule or get a ride right now with her husbands medical issues. She will speak to her daughter and call us  back if she decides to proceed prior to her trip.   Otherwise, will see back after to see if still out of rhythm and to discuss possibility of ablation given significant weight loss over the past several years.   Secondary hypercoagulable state Pt on Eliquis  as above   PVCs Flecainide  as above.  Intervals stable  HTN Stable on current regimen   Follow up with Dr. Kennyth in 2 months to assume care from Dr. Sage and discuss possible ablation or alternate AAD.   Signed, Sharper Prentice Lesia, PA-C

## 2024-01-23 ENCOUNTER — Ambulatory Visit: Attending: Student | Admitting: Student

## 2024-01-23 ENCOUNTER — Encounter: Payer: Self-pay | Admitting: Student

## 2024-01-23 VITALS — BP 126/72 | HR 77 | Ht 65.0 in | Wt 209.0 lb

## 2024-01-23 DIAGNOSIS — I493 Ventricular premature depolarization: Secondary | ICD-10-CM | POA: Diagnosis present

## 2024-01-23 DIAGNOSIS — I10 Essential (primary) hypertension: Secondary | ICD-10-CM | POA: Insufficient documentation

## 2024-01-23 DIAGNOSIS — D6869 Other thrombophilia: Secondary | ICD-10-CM | POA: Diagnosis present

## 2024-01-23 DIAGNOSIS — I480 Paroxysmal atrial fibrillation: Secondary | ICD-10-CM | POA: Insufficient documentation

## 2024-01-23 NOTE — Patient Instructions (Signed)
 Medication Instructions:  Your physician recommends that you continue on your current medications as directed. Please refer to the Current Medication list given to you today.  *If you need a refill on your cardiac medications before your next appointment, please call your pharmacy*  Lab Work: None ordered If you have labs (blood work) drawn today and your tests are completely normal, you will receive your results only by: MyChart Message (if you have MyChart) OR A paper copy in the mail If you have any lab test that is abnormal or we need to change your treatment, we will call you to review the results.  Follow-Up: At [facility], you and your health needs are our priority.  As part of our continuing mission to provide you with exceptional heart care, our providers are all part of one team.  This team includes your primary Cardiologist (physician) and Advanced Practice Providers or APPs (Physician Assistants and Nurse Practitioners) who all work together to provide you with the care you need, when you need it.  Your next appointment:   After Sept 22, 2025   Provider:   Fonda Kitty, MD to discuss ablation/meds

## 2024-01-28 ENCOUNTER — Other Ambulatory Visit: Payer: Self-pay | Admitting: Internal Medicine

## 2024-01-28 DIAGNOSIS — I480 Paroxysmal atrial fibrillation: Secondary | ICD-10-CM

## 2024-01-28 NOTE — Telephone Encounter (Signed)
 Prescription refill request for Eliquis  received. Indication:afib Last office visit:7/25 Scr:0.67  8/24 Age: 73 Weight:94.8  kg  Prescription refilled

## 2024-02-03 ENCOUNTER — Other Ambulatory Visit: Payer: Self-pay | Admitting: Internal Medicine

## 2024-03-26 ENCOUNTER — Encounter (HOSPITAL_BASED_OUTPATIENT_CLINIC_OR_DEPARTMENT_OTHER): Payer: Self-pay

## 2024-03-29 NOTE — Progress Notes (Unsigned)
 Electrophysiology Office Note:   Date:  03/30/2024  ID:  Crystal Roth, DOB 03-21-1951, MRN [id number]  Primary Cardiologist: None Electrophysiologist: Crystal Kitty, MD      History of Present Illness:   Crystal Roth is a 73 y.o. female with h/o AF, PVCs, and HTN who is being seen today for evaluation for catheter ablation at the request of Crystal Roth, [place].  Discussed the use of AI scribe software for clinical note transcription with the patient, who gave verbal consent to proceed.  History of Present Illness Crystal Roth is a 73 year old female with atrial fibrillation who presents for evaluation of her arrhythmia management. She was referred by Dr. Jodie Roth for evaluation of her atrial fibrillation management.  Patient had a fall in June 2025 and developed a persistent atrial fibrillation at that time.  She was seen by Crystal and offered a cardioversion, but declined.  She subsequently converted to normal rhythm.  She has a history of atrial fibrillation, managed with flecainide . Despite this, she continues to experience infrequent episodes, which she can usually manage with Cardizem  to lower her heart rate and resolve the episodes. However, a daily dose of 120 mg of Cardizem  caused her heart rate to drop to the 40s, making her feel as bad as when her heart rate was at 180. She has since adjusted her medication regimen.  Recently, she had to reduce her flecainide  dose to 75 mg twice a day due to a shortage while traveling in [place]. She reports that she thinks she feels better on this lower dose, but is not certain, and has not experienced atrial fibrillation since her last visit. She also takes diltiazem  30 mg as needed, which she uses twice daily to manage her diastolic blood pressure.  Her social history is significant for her husband's cardiac issues, which has been a source of stress. She recently traveled to [place], fulfilling her husband's bucket list wish. She is  concerned about managing her health and her husband's care, as she lives together with her daughter, who is an emergency room nurse.  Review of systems complete and found to be negative unless listed in HPI.   EP Information / Studies Reviewed:    EKG is ordered today. Personal review as below.  EKG Interpretation Date/Time:  Tuesday March 30 2024 10:06:37 EDT Ventricular Rate:  68 PR Interval:    QRS Duration:  108 QT Interval:  458 QTC Calculation: 487 R Axis:   37  Text Interpretation: Sinus rhythm Low voltage QRS When compared with ECG of 23-Jan-2024 10:06, Sinus rhythm has replaced Atrial fibrillation Confirmed by Crystal Roth [phone]) on 03/30/2024 10:16:28 AM   ECG 01/23/24: AF with PVCs   Echo 2017:  - Left ventricle: The cavity size was normal. There was mild focal    basal hypertrophy of the septum. Systolic function was normal.    The estimated ejection fraction was in the range of 55% to 60%.    Wall motion was normal; there were no regional wall motion    abnormalities. Left ventricular diastolic function parameters    were normal.  - Mitral valve: Moderately calcified annulus.  - Left atrium: The atrium was mildly dilated. Volume/bsa, S: 35.7    ml/m^2.  - Pulmonary arteries: Systolic pressure was mildly increased. PA    peak pressure: 36 mm Hg (S).    Risk Assessment/Calculations:    CHA2DS2-VASc Score = 4   This indicates a 4.8% annual risk of stroke.  The patient's score is based upon: CHF History: 0 HTN History: 1 Diabetes History: 1 Stroke History: 0 Vascular Disease History: 0 Age Score: 1 Gender Score: 1             Physical Exam:   VS:  BP (!) 108/50 (BP Location: Left Arm, Patient Position: Sitting, Cuff Size: Large)   Pulse 68   Ht 5' 4 (1.626 m)   Wt 198 lb 8 oz (90 kg)   SpO2 97%   BMI 34.07 kg/m    Wt Readings from Last 3 Encounters:  03/30/24 198 lb 8 oz (90 kg)  01/23/24 209 lb (94.8 kg)  12/09/23 227 lb 1.2 oz (103 kg)      GEN: Well nourished, well developed in no acute distress NECK: No JVD CARDIAC: Normal rate, regular rhythm RESPIRATORY:  Clear to auscultation without rales, wheezing or rhonchi  ABDOMEN: Soft, non-distended EXTREMITIES:  No edema; No deformity   ASSESSMENT AND PLAN:    #Persistent atrial fibrillation: Symptomatic.  Continues to have episodes despite flecainide . #Paroxysmal atrial flutter: #High risk medication use: Flecainide .  PR 212 ms and QRS 108 ms. - Currently taking flecainide  75 mg twice daily.  She has occasional episodes, during which she is symptomatic.  We discussed long-term options including changing from flecainide  to a different antiarrhythmic option versus pursuing catheter ablation.  Patient is leaning towards catheter ablation, but she has other family health issues that she would like to deal with at this time.  She would be an appropriate candidate for catheter ablation should she choose to pursue.  We would need an echocardiogram prior to catheter ablation as she has not had one in several years. - Continue flecainide  75 mg twice daily for now.  Continue Cardizem  30 mg twice daily with additional 30 g milligrams as needed.  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  No bleeding issues.  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 Wide and not classic outflow tract appearing.  Does not appear to be overly symptomatic from PVCs.  No PVCs on ECG today.  Last EF was normal. - Continue flecainide  as above.  Even if we pursue catheter ablation for AF, it would not be unreasonable to continue flecainide  for PVC suppression.  #Hypertension -At goal today.  Recommend checking blood pressures 1-2 times per week at home and recording the values.  Recommend bringing these recordings to the primary care physician.  Follow up with Dr. Kennyth in 6 months  Signed, Crystal Kennyth, MD

## 2024-03-30 ENCOUNTER — Encounter: Payer: Self-pay | Admitting: Cardiology

## 2024-03-30 ENCOUNTER — Ambulatory Visit: Attending: Cardiology | Admitting: Cardiology

## 2024-03-30 VITALS — BP 108/50 | HR 68 | Ht 64.0 in | Wt 198.5 lb

## 2024-03-30 DIAGNOSIS — I4819 Other persistent atrial fibrillation: Secondary | ICD-10-CM | POA: Insufficient documentation

## 2024-03-30 DIAGNOSIS — D6869 Other thrombophilia: Secondary | ICD-10-CM | POA: Diagnosis present

## 2024-03-30 DIAGNOSIS — I10 Essential (primary) hypertension: Secondary | ICD-10-CM | POA: Diagnosis present

## 2024-03-30 DIAGNOSIS — I493 Ventricular premature depolarization: Secondary | ICD-10-CM | POA: Diagnosis present

## 2024-03-30 DIAGNOSIS — Z79899 Other long term (current) drug therapy: Secondary | ICD-10-CM | POA: Insufficient documentation

## 2024-03-30 DIAGNOSIS — I4892 Unspecified atrial flutter: Secondary | ICD-10-CM | POA: Diagnosis present

## 2024-03-30 MED ORDER — FLECAINIDE ACETATE 150 MG PO TABS: 75.0000 mg | ORAL_TABLET | Freq: Two times a day (BID) | ORAL | 3 refills | Status: AC

## 2024-03-30 NOTE — Patient Instructions (Signed)
 Medication Instructions:  Your physician recommends that you continue on your current medications as directed. Please refer to the Current Medication list given to you today.  *If you need a refill on your cardiac medications before your next appointment, please call your pharmacy*  Follow-Up: At [facility], you and your health needs are our priority.  As part of our continuing mission to provide you with exceptional heart care, our providers are all part of one team.  This team includes your primary Cardiologist (physician) and Advanced Practice Providers or APPs (Physician Assistants and Nurse Practitioners) who all work together to provide you with the care you need, when you need it.  Your next appointment:   6 months  Provider:   Ardeen Kohler, MD

## 2024-04-30 ENCOUNTER — Other Ambulatory Visit: Payer: Self-pay | Admitting: Student

## 2024-04-30 DIAGNOSIS — E876 Hypokalemia: Secondary | ICD-10-CM

## 2024-07-29 ENCOUNTER — Ambulatory Visit (HOSPITAL_COMMUNITY)
Admission: RE | Admit: 2024-07-29 | Discharge: 2024-07-29 | Disposition: A | Discharge status: Home / Self Care | Source: Ambulatory Visit | Attending: Gerontology | Admitting: Gerontology

## 2024-07-29 ENCOUNTER — Other Ambulatory Visit (HOSPITAL_COMMUNITY): Payer: Self-pay | Admitting: Gerontology

## 2024-07-29 DIAGNOSIS — R059 Cough, unspecified: Secondary | ICD-10-CM | POA: Diagnosis present

## 2024-07-29 DIAGNOSIS — R0989 Other specified symptoms and signs involving the circulatory and respiratory systems: Secondary | ICD-10-CM | POA: Insufficient documentation

## 2024-08-12 ENCOUNTER — Other Ambulatory Visit: Payer: Self-pay | Admitting: Cardiology

## 2024-08-12 DIAGNOSIS — E876 Hypokalemia: Secondary | ICD-10-CM
